# Patient Record
Sex: Female | Born: 1965 | Race: White | Hispanic: No | Marital: Married | Smoking: Light tobacco smoker
Health system: Southern US, Community
[De-identification: ages and names within clinical notes are randomized; demographics above are authoritative.]

## PROBLEM LIST (undated history)

## (undated) DIAGNOSIS — IMO0002 Reserved for concepts with insufficient information to code with codable children: Secondary | ICD-10-CM

## (undated) DIAGNOSIS — Z79890 Hormone replacement therapy (postmenopausal): Secondary | ICD-10-CM

## (undated) DIAGNOSIS — R87629 Unspecified abnormal cytological findings in specimens from vagina: Secondary | ICD-10-CM

## (undated) DIAGNOSIS — M5412 Radiculopathy, cervical region: Secondary | ICD-10-CM

## (undated) DIAGNOSIS — R87619 Unspecified abnormal cytological findings in specimens from cervix uteri: Secondary | ICD-10-CM

## (undated) DIAGNOSIS — M47812 Spondylosis without myelopathy or radiculopathy, cervical region: Secondary | ICD-10-CM

## (undated) DIAGNOSIS — G500 Trigeminal neuralgia: Secondary | ICD-10-CM

## (undated) DIAGNOSIS — N951 Menopausal and female climacteric states: Secondary | ICD-10-CM

## (undated) HISTORY — DX: Trigeminal neuralgia: G50.0

## (undated) HISTORY — DX: Hormone replacement therapy: Z79.890

## (undated) HISTORY — DX: Unspecified abnormal cytological findings in specimens from vagina: R87.629

## (undated) HISTORY — DX: Radiculopathy, cervical region: M54.12

## (undated) HISTORY — DX: Reserved for concepts with insufficient information to code with codable children: IMO0002

## (undated) HISTORY — DX: Unspecified abnormal cytological findings in specimens from cervix uteri: R87.619

## (undated) HISTORY — DX: Spondylosis without myelopathy or radiculopathy, cervical region: M47.812

## (undated) HISTORY — DX: Menopausal and female climacteric states: N95.1

## (undated) HISTORY — PX: TONSILLECTOMY AND ADENOIDECTOMY: SHX28

---

## 2001-10-22 ENCOUNTER — Other Ambulatory Visit: Admission: RE | Admit: 2001-10-22 | Discharge: 2001-10-22 | Payer: Self-pay | Admitting: Obstetrics and Gynecology

## 2004-02-18 ENCOUNTER — Other Ambulatory Visit: Admission: RE | Admit: 2004-02-18 | Discharge: 2004-02-18 | Payer: Self-pay | Admitting: Obstetrics & Gynecology

## 2005-06-05 ENCOUNTER — Emergency Department (HOSPITAL_COMMUNITY): Admission: EM | Admit: 2005-06-05 | Discharge: 2005-06-05 | Payer: Self-pay | Admitting: Emergency Medicine

## 2006-11-27 ENCOUNTER — Ambulatory Visit (HOSPITAL_COMMUNITY): Admission: RE | Admit: 2006-11-27 | Discharge: 2006-11-27 | Payer: Self-pay | Admitting: Family Medicine

## 2008-01-01 ENCOUNTER — Other Ambulatory Visit: Admission: RE | Admit: 2008-01-01 | Discharge: 2008-01-01 | Payer: Self-pay | Admitting: Obstetrics and Gynecology

## 2008-11-21 ENCOUNTER — Ambulatory Visit (HOSPITAL_COMMUNITY): Admission: RE | Admit: 2008-11-21 | Discharge: 2008-11-21 | Payer: Self-pay | Admitting: Family Medicine

## 2009-01-02 ENCOUNTER — Ambulatory Visit (HOSPITAL_COMMUNITY): Admission: RE | Admit: 2009-01-02 | Discharge: 2009-01-02 | Payer: Self-pay | Admitting: Obstetrics & Gynecology

## 2009-04-23 ENCOUNTER — Other Ambulatory Visit: Admission: RE | Admit: 2009-04-23 | Discharge: 2009-04-23 | Payer: Self-pay | Admitting: Obstetrics & Gynecology

## 2010-01-04 ENCOUNTER — Ambulatory Visit (HOSPITAL_COMMUNITY): Admission: RE | Admit: 2010-01-04 | Discharge: 2010-01-04 | Payer: Self-pay | Admitting: Obstetrics & Gynecology

## 2010-09-05 ENCOUNTER — Encounter: Payer: Self-pay | Admitting: Obstetrics & Gynecology

## 2010-11-01 ENCOUNTER — Other Ambulatory Visit (HOSPITAL_COMMUNITY)
Admission: RE | Admit: 2010-11-01 | Discharge: 2010-11-01 | Disposition: A | Discharge status: Home / Self Care | Payer: BC Managed Care – PPO | Source: Ambulatory Visit | Attending: Obstetrics and Gynecology | Admitting: Obstetrics and Gynecology

## 2010-11-01 ENCOUNTER — Other Ambulatory Visit: Payer: Self-pay | Admitting: Adult Health

## 2010-11-01 DIAGNOSIS — Z01419 Encounter for gynecological examination (general) (routine) without abnormal findings: Secondary | ICD-10-CM | POA: Insufficient documentation

## 2010-12-20 ENCOUNTER — Other Ambulatory Visit: Payer: Self-pay | Admitting: Obstetrics & Gynecology

## 2010-12-20 DIAGNOSIS — Z139 Encounter for screening, unspecified: Secondary | ICD-10-CM

## 2011-01-07 ENCOUNTER — Ambulatory Visit (HOSPITAL_COMMUNITY): Payer: BC Managed Care – PPO

## 2011-02-18 ENCOUNTER — Ambulatory Visit (HOSPITAL_COMMUNITY): Payer: BC Managed Care – PPO

## 2011-03-17 ENCOUNTER — Ambulatory Visit (HOSPITAL_COMMUNITY)
Admission: RE | Admit: 2011-03-17 | Discharge: 2011-03-17 | Disposition: A | Discharge status: Home / Self Care | Payer: BC Managed Care – PPO | Source: Ambulatory Visit | Attending: Obstetrics & Gynecology | Admitting: Obstetrics & Gynecology

## 2011-03-17 DIAGNOSIS — Z139 Encounter for screening, unspecified: Secondary | ICD-10-CM

## 2011-03-17 DIAGNOSIS — Z1231 Encounter for screening mammogram for malignant neoplasm of breast: Secondary | ICD-10-CM | POA: Insufficient documentation

## 2011-04-16 ENCOUNTER — Emergency Department (HOSPITAL_COMMUNITY): Admission: EM | Admit: 2011-04-16 | Payer: BC Managed Care – PPO | Source: Home / Self Care

## 2012-03-27 ENCOUNTER — Other Ambulatory Visit: Payer: Self-pay | Admitting: Obstetrics & Gynecology

## 2012-03-27 DIAGNOSIS — Z139 Encounter for screening, unspecified: Secondary | ICD-10-CM

## 2012-04-05 ENCOUNTER — Ambulatory Visit (HOSPITAL_COMMUNITY): Payer: BC Managed Care – PPO

## 2012-04-05 ENCOUNTER — Ambulatory Visit (HOSPITAL_COMMUNITY)
Admission: RE | Admit: 2012-04-05 | Discharge: 2012-04-05 | Disposition: A | Discharge status: Home / Self Care | Payer: BC Managed Care – PPO | Source: Ambulatory Visit | Attending: Obstetrics & Gynecology | Admitting: Obstetrics & Gynecology

## 2012-04-05 DIAGNOSIS — Z139 Encounter for screening, unspecified: Secondary | ICD-10-CM

## 2012-04-05 DIAGNOSIS — R922 Inconclusive mammogram: Secondary | ICD-10-CM | POA: Insufficient documentation

## 2012-04-05 DIAGNOSIS — Z1231 Encounter for screening mammogram for malignant neoplasm of breast: Secondary | ICD-10-CM | POA: Insufficient documentation

## 2012-04-09 ENCOUNTER — Other Ambulatory Visit: Payer: Self-pay | Admitting: Obstetrics & Gynecology

## 2012-04-09 DIAGNOSIS — R928 Other abnormal and inconclusive findings on diagnostic imaging of breast: Secondary | ICD-10-CM

## 2012-04-18 ENCOUNTER — Ambulatory Visit (HOSPITAL_COMMUNITY)
Admission: RE | Admit: 2012-04-18 | Discharge: 2012-04-18 | Disposition: A | Discharge status: Home / Self Care | Payer: BC Managed Care – PPO | Source: Ambulatory Visit | Attending: Obstetrics & Gynecology | Admitting: Obstetrics & Gynecology

## 2012-04-18 ENCOUNTER — Other Ambulatory Visit: Payer: Self-pay | Admitting: Obstetrics & Gynecology

## 2012-04-18 DIAGNOSIS — R928 Other abnormal and inconclusive findings on diagnostic imaging of breast: Secondary | ICD-10-CM

## 2012-04-18 DIAGNOSIS — Z853 Personal history of malignant neoplasm of breast: Secondary | ICD-10-CM | POA: Insufficient documentation

## 2012-04-18 DIAGNOSIS — N6009 Solitary cyst of unspecified breast: Secondary | ICD-10-CM | POA: Insufficient documentation

## 2012-04-19 ENCOUNTER — Telehealth: Payer: Self-pay | Admitting: *Deleted

## 2012-04-19 NOTE — Telephone Encounter (Signed)
Patient returned my call and I confirmed 06/04/12 genetic appt w/ her.

## 2012-04-19 NOTE — Telephone Encounter (Signed)
Left message for pt to return my call so I can schedule a genetic appt.  

## 2012-05-11 ENCOUNTER — Telehealth: Payer: Self-pay | Admitting: *Deleted

## 2012-05-11 NOTE — Telephone Encounter (Signed)
Left message for pt again about rescheduling her genetic appt.

## 2012-05-31 ENCOUNTER — Telehealth: Payer: Self-pay | Admitting: *Deleted

## 2012-05-31 NOTE — Telephone Encounter (Signed)
Patient called stating something came up and she cannot make her appt  06/04/12.  She requested that I cancel it and she will call back at a later date to reschedule.

## 2012-06-04 ENCOUNTER — Other Ambulatory Visit: Payer: BC Managed Care – PPO | Admitting: Lab

## 2012-06-04 ENCOUNTER — Encounter: Payer: BC Managed Care – PPO | Admitting: Genetic Counselor

## 2012-07-13 ENCOUNTER — Emergency Department (HOSPITAL_COMMUNITY)
Admission: EM | Admit: 2012-07-13 | Discharge: 2012-07-13 | Disposition: A | Discharge status: Home / Self Care | Payer: BC Managed Care – PPO | Attending: Emergency Medicine | Admitting: Emergency Medicine

## 2012-07-13 ENCOUNTER — Encounter (HOSPITAL_COMMUNITY): Payer: Self-pay | Admitting: *Deleted

## 2012-07-13 DIAGNOSIS — F17200 Nicotine dependence, unspecified, uncomplicated: Secondary | ICD-10-CM | POA: Insufficient documentation

## 2012-07-13 DIAGNOSIS — M5412 Radiculopathy, cervical region: Secondary | ICD-10-CM | POA: Insufficient documentation

## 2012-07-13 MED ORDER — OXYCODONE-ACETAMINOPHEN 5-325 MG PO TABS
2.0000 | ORAL_TABLET | Freq: Once | ORAL | Status: AC
  Administered 2012-07-13: 2 via ORAL
  Filled 2012-07-13: qty 2

## 2012-07-13 MED ORDER — IBUPROFEN 400 MG PO TABS
400.0000 mg | ORAL_TABLET | Freq: Once | ORAL | Status: AC
  Administered 2012-07-13: 400 mg via ORAL
  Filled 2012-07-13: qty 1

## 2012-07-13 MED ORDER — NAPROXEN 500 MG PO TABS: 500.0000 mg | ORAL_TABLET | Freq: Two times a day (BID) | ORAL | Status: DC | PRN

## 2012-07-13 NOTE — ED Notes (Signed)
Pt c/o neck pain and tingling to left arm and shoulder. Pt states sx began Thursday am and has been on and off all day. Pt states it has gotten worse in the last hour.

## 2012-07-17 NOTE — ED Provider Notes (Signed)
History    46 year old female left neck pain. Denies trauma. Symptoms began Thursday and have been intermittent. Pain radiates into her left shoulder down her left arm. Feels like pinpricks and tingling. No appreciable exacerbating / relieving factors. No weakness. Patient with no other complaints.  CSN: [id number]  Arrival date & time 07/13/12  0136   First MD Initiated Contact with Patient 07/13/12 0151      Chief Complaint  Patient presents with  . Neck Pain    (Consider location/radiation/quality/duration/timing/severity/associated sxs/prior treatment) HPI  History reviewed. No pertinent past medical history.  History reviewed. No pertinent past surgical history.  Family History  Problem Relation Age of Onset  . Cancer Mother     History  Substance Use Topics  . Smoking status: Current Some Day Smoker    Types: Cigarettes  . Smokeless tobacco: Not on file  . Alcohol Use: Yes     Comment: occasional    OB History    Grav Para Term Preterm Abortions TAB SAB Ect Mult Living                  Review of Systems   Review of symptoms negative unless otherwise noted in HPI.   Allergies  Review of patient's allergies indicates no known allergies.  Home Medications   Current Outpatient Rx  Name  Route  Sig  Dispense  Refill  . NAPROXEN 500 MG PO TABS   Oral   Take 1 tablet (500 mg total) by mouth 2 (two) times daily as needed.   20 tablet   0     BP 117/69  Pulse 68  Temp 97.9 F (36.6 C) (Oral)  Resp 20  Ht 5\' 11"  (1.803 m)  Wt 140 lb (63.504 kg)  BMI 19.53 kg/m2  SpO2 99%  LMP 07/13/2012  Physical Exam  Nursing note and vitals reviewed. Constitutional: She appears well-developed and well-nourished. No distress.  HENT:  Head: Normocephalic and atraumatic.  Eyes: Conjunctivae normal are normal. Right eye exhibits no discharge. Left eye exhibits no discharge.  Neck: Neck supple.  Cardiovascular: Normal rate, regular rhythm and normal heart  sounds.  Exam reveals no gallop and no friction rub.   No murmur heard. Pulmonary/Chest: Effort normal and breath sounds normal. No respiratory distress.  Abdominal: Soft. She exhibits no distension. There is no tenderness.  Musculoskeletal: She exhibits no edema and no tenderness.       No midline spinal tenderness.  Neurological: She is alert. She exhibits normal muscle tone. Coordination normal.       Strength 5/5 b/l upper extremities. Median/ulnar and radial function intact. Good radial pulses b/l.  Skin: Skin is warm and dry.  Psychiatric: She has a normal mood and affect. Her behavior is normal. Thought content normal.    ED Course  Procedures (including critical care time)  Labs Reviewed - No data to display No results found.   1. Cervical radiculopathy       MDM  46 year old female with atraumatic left neck and left upper extremity pain. Symptoms consistent with a cervical radiculopathy. No indication for emergent imaging. Neuro exam is nonfocal. Plan symptomatic treatment.         Raeford Razor, MD 07/17/12 0005

## 2012-08-16 ENCOUNTER — Other Ambulatory Visit (HOSPITAL_COMMUNITY): Payer: Self-pay | Admitting: Neurosurgery

## 2012-08-16 DIAGNOSIS — M4802 Spinal stenosis, cervical region: Secondary | ICD-10-CM

## 2012-10-18 ENCOUNTER — Telehealth: Payer: Self-pay | Admitting: *Deleted

## 2012-10-18 NOTE — Telephone Encounter (Signed)
Pt called stating that her mom just tested positive and wants to come and talk with Clydie Braun.  Confirmed 12/24/12 appt w pt.

## 2012-11-09 ENCOUNTER — Telehealth: Payer: Self-pay | Admitting: *Deleted

## 2012-11-09 NOTE — Telephone Encounter (Signed)
Pt called requesting for me to cancel her genetic appt on 5/12 due to having the testing done in [facility] where her mom had it.  Cancelled appt as pt requested.

## 2012-12-24 ENCOUNTER — Encounter: Payer: BC Managed Care – PPO | Admitting: Genetic Counselor

## 2012-12-24 ENCOUNTER — Other Ambulatory Visit: Payer: BC Managed Care – PPO | Admitting: Lab

## 2013-02-11 ENCOUNTER — Telehealth: Payer: Self-pay | Admitting: Family Medicine

## 2013-02-11 ENCOUNTER — Other Ambulatory Visit: Payer: Self-pay | Admitting: Nurse Practitioner

## 2013-02-11 MED ORDER — NAPROXEN 500 MG PO TABS: 500.0000 mg | ORAL_TABLET | Freq: Two times a day (BID) | ORAL | Status: DC

## 2013-02-11 NOTE — Telephone Encounter (Signed)
Pt had 4 ruptured discs 3 or 4 years ago and was referred to Dr. Channing Mutters. He is currently out of town and she was instructed to reach out to her family doctor to get a prescription for an anti-inflammatory because she is having an irritation in her neck area. She uses [facility].

## 2013-02-11 NOTE — Telephone Encounter (Signed)
naproxen

## 2013-04-09 ENCOUNTER — Other Ambulatory Visit: Payer: Self-pay | Admitting: Obstetrics & Gynecology

## 2013-04-09 DIAGNOSIS — Z139 Encounter for screening, unspecified: Secondary | ICD-10-CM

## 2013-04-22 ENCOUNTER — Ambulatory Visit (HOSPITAL_COMMUNITY)
Admission: RE | Admit: 2013-04-22 | Discharge: 2013-04-22 | Disposition: A | Discharge status: Home / Self Care | Payer: BC Managed Care – PPO | Source: Ambulatory Visit | Attending: Obstetrics & Gynecology | Admitting: Obstetrics & Gynecology

## 2013-04-22 DIAGNOSIS — Z139 Encounter for screening, unspecified: Secondary | ICD-10-CM

## 2013-04-22 DIAGNOSIS — Z1231 Encounter for screening mammogram for malignant neoplasm of breast: Secondary | ICD-10-CM | POA: Insufficient documentation

## 2013-04-23 ENCOUNTER — Other Ambulatory Visit: Payer: Self-pay | Admitting: Obstetrics & Gynecology

## 2013-04-23 DIAGNOSIS — R928 Other abnormal and inconclusive findings on diagnostic imaging of breast: Secondary | ICD-10-CM

## 2013-05-01 ENCOUNTER — Other Ambulatory Visit (HOSPITAL_COMMUNITY): Payer: Self-pay | Admitting: Obstetrics & Gynecology

## 2013-05-01 ENCOUNTER — Ambulatory Visit (HOSPITAL_COMMUNITY)
Admission: RE | Admit: 2013-05-01 | Discharge: 2013-05-01 | Disposition: A | Discharge status: Home / Self Care | Payer: BC Managed Care – PPO | Source: Ambulatory Visit | Attending: Obstetrics & Gynecology | Admitting: Obstetrics & Gynecology

## 2013-05-01 DIAGNOSIS — R928 Other abnormal and inconclusive findings on diagnostic imaging of breast: Secondary | ICD-10-CM

## 2013-06-04 ENCOUNTER — Ambulatory Visit (INDEPENDENT_AMBULATORY_CARE_PROVIDER_SITE_OTHER): Payer: BC Managed Care – PPO | Admitting: Adult Health

## 2013-06-04 ENCOUNTER — Other Ambulatory Visit (HOSPITAL_COMMUNITY)
Admission: RE | Admit: 2013-06-04 | Discharge: 2013-06-04 | Disposition: A | Discharge status: Home / Self Care | Payer: BC Managed Care – PPO | Source: Ambulatory Visit | Attending: Adult Health | Admitting: Adult Health

## 2013-06-04 ENCOUNTER — Encounter: Payer: Self-pay | Admitting: Adult Health

## 2013-06-04 ENCOUNTER — Encounter (INDEPENDENT_AMBULATORY_CARE_PROVIDER_SITE_OTHER): Payer: Self-pay

## 2013-06-04 VITALS — BP 110/60 | HR 74 | Ht 71.0 in | Wt 152.0 lb

## 2013-06-04 DIAGNOSIS — Z01419 Encounter for gynecological examination (general) (routine) without abnormal findings: Secondary | ICD-10-CM

## 2013-06-04 DIAGNOSIS — N951 Menopausal and female climacteric states: Secondary | ICD-10-CM

## 2013-06-04 DIAGNOSIS — Z1151 Encounter for screening for human papillomavirus (HPV): Secondary | ICD-10-CM | POA: Insufficient documentation

## 2013-06-04 DIAGNOSIS — Z1212 Encounter for screening for malignant neoplasm of rectum: Secondary | ICD-10-CM

## 2013-06-04 HISTORY — DX: Menopausal and female climacteric states: N95.1

## 2013-06-04 LAB — HEMOCCULT GUIAC POC 1CARD (OFFICE)

## 2013-06-04 LAB — CBC
MCHC: 34.1 g/dL (ref 30.0–36.0)
MCV: 92.5 fL (ref 78.0–100.0)
RBC: 3.99 MIL/uL (ref 3.87–5.11)

## 2013-06-04 NOTE — Patient Instructions (Signed)

## 2013-06-04 NOTE — Progress Notes (Signed)
Patient ID: Tina Galloway, female   DOB: [birth year], 47 y.o.   MRN: [id number] History of Present Illness: Tina Galloway is a 47 year old white female recently remarried in for pap and physical.She is complaining of hot flashes, weight fluctuations of about 10 lbs,not sleeping well, some headaches but grinds teeth and got braces,has been moody and has been having irregular periods.Smokes occasionally. MOM had breast cancer and she had testing and was BRCA negative.  Current Medications, Allergies, Past Medical History, Past Surgical History, Family History and Social History were reviewed in Tina Galloway record.     Review of Systems: Patient denies any  blurred vision, shortness of breath, chest pain, abdominal pain, problems with bowel movements, urination, or intercourse. Positives in HPI.    Physical Exam:BP 110/60  Pulse 74  Ht 5\' 11"  (1.803 m)  Wt 152 lb (68.947 kg)  BMI 21.21 kg/m2  LMP 05/02/2013 General:  Well developed, well nourished, no acute distress Skin:  Warm and dry Neck:  Midline trachea, normal thyroid Lungs; Clear to auscultation bilaterally Breast:  No dominant palpable mass, retraction, or nipple discharge Cardiovascular: Regular rate and rhythm Abdomen:  Soft, non tender, no hepatosplenomegaly Pelvic:  External genitalia is normal in appearance.  The vagina is normal in appearance. The cervix is bulbous. Pap with HPV. Uterus is felt to be normal size, shape, and contour.  No  adnexal masses or tenderness noted. Rectal: Good sphincter tone, no polyps, or hemorrhoids felt.  Hemoccult negative. Extremities:  No swelling or varicosities noted Psych:  Alert and cooperative   Impression: Yearly gyn exam Menopausal symptoms    Plan: Check CBC,CMP,TSH and FSH Physical in 1 year Mammogram yearly Discussed HRT and will give handout to review Follow up for lab results

## 2013-06-05 ENCOUNTER — Telehealth: Payer: Self-pay | Admitting: Adult Health

## 2013-06-05 LAB — FOLLICLE STIMULATING HORMONE: FSH: 3.8 m[IU]/mL

## 2013-06-05 LAB — COMPREHENSIVE METABOLIC PANEL
ALT: 11 U/L (ref 0–35)
AST: 17 U/L (ref 0–37)
Albumin: 4.3 g/dL (ref 3.5–5.2)
BUN: 9 mg/dL (ref 6–23)
Chloride: 109 mEq/L (ref 96–112)
Potassium: 3.7 mEq/L (ref 3.5–5.3)
Total Bilirubin: 0.5 mg/dL (ref 0.3–1.2)
Total Protein: 6.6 g/dL (ref 6.0–8.3)

## 2013-06-05 NOTE — Telephone Encounter (Signed)
Left message that labs and pap were normal, to call me back

## 2013-06-06 ENCOUNTER — Telehealth: Payer: Self-pay

## 2013-06-06 MED ORDER — ESTRADIOL-NORETHINDRONE ACET 0.05-0.14 MG/DAY TD PTTW: 1.0000 | MEDICATED_PATCH | TRANSDERMAL | Status: DC

## 2013-06-06 NOTE — Telephone Encounter (Signed)
Pt aware of labs but would like to try hormone patch to see if she feels better

## 2013-06-17 ENCOUNTER — Encounter: Payer: Self-pay | Admitting: Adult Health

## 2013-06-19 ENCOUNTER — Other Ambulatory Visit: Payer: Self-pay | Admitting: Adult Health

## 2013-06-19 MED ORDER — ESTRADIOL-NORETHINDRONE ACET 1-0.5 MG PO TABS: 1.0000 | ORAL_TABLET | Freq: Every day | ORAL | Status: DC

## 2013-06-20 ENCOUNTER — Other Ambulatory Visit: Payer: Self-pay

## 2013-08-02 ENCOUNTER — Other Ambulatory Visit: Payer: Self-pay | Admitting: *Deleted

## 2013-08-02 MED ORDER — ESTRADIOL-NORETHINDRONE ACET 1-0.5 MG PO TABS: 1.0000 | ORAL_TABLET | Freq: Every day | ORAL | Status: DC

## 2013-08-12 ENCOUNTER — Telehealth: Payer: Self-pay | Admitting: *Deleted

## 2013-08-12 NOTE — Telephone Encounter (Signed)
Left message letting pt know Mimvey had been refilled on 08/02/13, 90 day supply [facility] [place]. JSY

## 2014-01-21 ENCOUNTER — Other Ambulatory Visit: Payer: Self-pay | Admitting: Adult Health

## 2014-04-16 ENCOUNTER — Other Ambulatory Visit: Payer: Self-pay | Admitting: Obstetrics and Gynecology

## 2014-04-16 DIAGNOSIS — Z1231 Encounter for screening mammogram for malignant neoplasm of breast: Secondary | ICD-10-CM

## 2014-04-17 ENCOUNTER — Encounter: Payer: Self-pay | Admitting: Nurse Practitioner

## 2014-04-17 ENCOUNTER — Ambulatory Visit (INDEPENDENT_AMBULATORY_CARE_PROVIDER_SITE_OTHER): Payer: BC Managed Care – PPO | Admitting: Nurse Practitioner

## 2014-04-17 VITALS — BP 110/74 | Ht 71.0 in | Wt 152.0 lb

## 2014-04-17 DIAGNOSIS — J3089 Other allergic rhinitis: Secondary | ICD-10-CM

## 2014-04-17 DIAGNOSIS — J0100 Acute maxillary sinusitis, unspecified: Secondary | ICD-10-CM

## 2014-04-17 DIAGNOSIS — J302 Other seasonal allergic rhinitis: Secondary | ICD-10-CM

## 2014-04-17 MED ORDER — AZITHROMYCIN 250 MG PO TABS
ORAL_TABLET | ORAL | Status: DC
Start: 2014-04-17 — End: 2014-06-06

## 2014-04-17 MED ORDER — METHYLPREDNISOLONE ACETATE 40 MG/ML IJ SUSP
40.0000 mg | Freq: Once | INTRAMUSCULAR | Status: AC
Start: 2014-04-17 — End: 2014-04-17
  Administered 2014-04-17: 40 mg via INTRAMUSCULAR

## 2014-04-17 NOTE — Patient Instructions (Signed)
OTC antihistamine Nasacort AQ as directed Zaditor eye drops 

## 2014-04-20 ENCOUNTER — Encounter: Payer: Self-pay | Admitting: Nurse Practitioner

## 2014-04-20 NOTE — Progress Notes (Signed)
Subjective:  Presents with complaints of sinus symptoms over the past week. Initially made an appointment for her left neck and shoulder pain which flared up after running a 5K. This is a chronic problem that is being followed by neurosurgery. It was worse for 7-10 days, much improved today. Having maxillary area sinus pressure. Worse with bending. It radiates into the teeth. No fever. Slight nonproductive cough in the mornings. Watery eyes. Runny nose. Sneezing. Slight wheezing for the past 2 days. Nonsmoker.  Objective:   BP 110/74  Ht  (1.803 m)  Wt 152 lb (68.947 kg)  BMI 21.21 kg/m2  LMP 04/11/2014 NAD. Alert, oriented. TMs significant effusion, no erythema. Pharynx injected with PND noted. Neck supple with mild soft anterior adenopathy. Lungs clear. Heart regular rate rhythm.  Assessment: Acute maxillary sinusitis, recurrence not specified - Plan: methylPREDNISolone acetate (DEPO-MEDROL) injection 40 mg  Other seasonal allergic rhinitis - Plan: methylPREDNISolone acetate (DEPO-MEDROL) injection 40 mg  Plan:  Meds ordered this encounter  Medications  . azithromycin (ZITHROMAX Z-PAK) 250 MG tablet    Sig: Take 2 tablets (500 mg) on  Day 1,  followed by 1 tablet (250 mg) once daily on Days 2 through 5.    Dispense:  6 each    Refill:  0    Order Specific Question:  Supervising Provider    Answer:  Merlyn Albert [[id number]]  . methylPREDNISolone acetate (DEPO-MEDROL) injection 40 mg    Sig:    OTC antihistamine Nasacort AQ as directed Zaditor eye drops  Call back if symptoms worsen or persist.

## 2014-05-05 ENCOUNTER — Ambulatory Visit (HOSPITAL_COMMUNITY)
Admission: RE | Admit: 2014-05-05 | Discharge: 2014-05-05 | Disposition: A | Discharge status: Home / Self Care | Payer: BC Managed Care – PPO | Source: Ambulatory Visit | Attending: Obstetrics and Gynecology | Admitting: Obstetrics and Gynecology

## 2014-05-05 DIAGNOSIS — Z1231 Encounter for screening mammogram for malignant neoplasm of breast: Secondary | ICD-10-CM | POA: Diagnosis present

## 2014-05-30 ENCOUNTER — Other Ambulatory Visit: Payer: Self-pay

## 2014-06-06 ENCOUNTER — Encounter: Payer: Self-pay | Admitting: Adult Health

## 2014-06-06 ENCOUNTER — Ambulatory Visit (INDEPENDENT_AMBULATORY_CARE_PROVIDER_SITE_OTHER): Payer: BC Managed Care – PPO | Admitting: Adult Health

## 2014-06-06 VITALS — BP 110/72 | HR 72 | Ht 68.0 in | Wt 148.0 lb

## 2014-06-06 DIAGNOSIS — Z1212 Encounter for screening for malignant neoplasm of rectum: Secondary | ICD-10-CM

## 2014-06-06 DIAGNOSIS — Z01419 Encounter for gynecological examination (general) (routine) without abnormal findings: Secondary | ICD-10-CM

## 2014-06-06 LAB — HEMOCCULT GUIAC POC 1CARD (OFFICE): Fecal Occult Blood, POC: NEGATIVE

## 2014-06-06 NOTE — Progress Notes (Signed)
Patient ID: Tina Galloway, female   DOB: 03-01-1966, 48 y.o.   MRN: [id number] History of Present Illness: Tina Galloway is a 48 year old white female, widowed, in for a physical.She had a normal pap with negative HPV 06/04/13.No complaints.   Current Medications, Allergies, Past Medical History, Past Surgical History, Family History and Social History were reviewed in [facility] electronic medical record.     Review of Systems: Patient denies any headaches, blurred vision, shortness of breath, chest pain, abdominal pain, problems with bowel movements, urination, or intercourse. No joint pain or mood swings, has stopped HRT doing good at present.    Physical Exam:BP 110/72  Pulse 72  Ht 5\' 8"  (1.727 m)  Wt 148 lb (67.132 kg)  BMI 22.51 kg/m2  LMP 05/23/2014 General:  Well developed, well nourished, no acute distress Skin:  Warm and dry Neck:  Midline trachea, normal thyroid Lungs; Clear to auscultation bilaterally Breast:  No dominant palpable mass, retraction, or nipple discharge Cardiovascular: Regular rate and rhythm Abdomen:  Soft, non tender, no hepatosplenomegaly Pelvic:  External genitalia is normal in appearance.  The vagina is normal in appearance.  The cervix is bulbous.  Uterus is felt to be normal size, shape, and contour.  No  adnexal masses or tenderness noted. Rectal: Good sphincter tone, no polyps, or hemorrhoids felt.  Hemoccult negative. Extremities:  No swelling or varicosities noted Psych:  No mood changes,alert and cooperative,seems happy   Impression: Well woman gyn exam no pap    Plan: Physical in 1 year Mammogram yearly  Colonoscopy at 52  Fasting labs next year Get flu shot

## 2014-06-06 NOTE — Patient Instructions (Signed)
Physical in 1 year mammogram yearly colonoscopy at 52 Fasting labs next year

## 2014-06-16 ENCOUNTER — Encounter: Payer: Self-pay | Admitting: Adult Health

## 2014-07-19 ENCOUNTER — Emergency Department (HOSPITAL_COMMUNITY)
Admission: EM | Admit: 2014-07-19 | Discharge: 2014-07-19 | Disposition: A | Discharge status: Home / Self Care | Payer: BC Managed Care – PPO | Attending: Emergency Medicine | Admitting: Emergency Medicine

## 2014-07-19 ENCOUNTER — Encounter (HOSPITAL_COMMUNITY): Payer: Self-pay

## 2014-07-19 DIAGNOSIS — L0201 Cutaneous abscess of face: Secondary | ICD-10-CM | POA: Diagnosis not present

## 2014-07-19 DIAGNOSIS — Z792 Long term (current) use of antibiotics: Secondary | ICD-10-CM | POA: Diagnosis not present

## 2014-07-19 DIAGNOSIS — Z791 Long term (current) use of non-steroidal anti-inflammatories (NSAID): Secondary | ICD-10-CM | POA: Insufficient documentation

## 2014-07-19 DIAGNOSIS — Z720 Tobacco use: Secondary | ICD-10-CM | POA: Diagnosis not present

## 2014-07-19 MED ORDER — ONDANSETRON HCL 4 MG PO TABS
ORAL_TABLET | ORAL | Status: AC
  Filled 2014-07-19: qty 1

## 2014-07-19 MED ORDER — HYDROCODONE-ACETAMINOPHEN 5-325 MG PO TABS: 1.0000 | ORAL_TABLET | ORAL | Status: DC | PRN

## 2014-07-19 MED ORDER — DOXYCYCLINE HYCLATE 100 MG PO TABS
ORAL_TABLET | ORAL | Status: DC
Start: 2014-07-19 — End: 2014-07-19
  Filled 2014-07-19: qty 1

## 2014-07-19 MED ORDER — IBUPROFEN 600 MG PO TABS: 600.0000 mg | ORAL_TABLET | Freq: Four times a day (QID) | ORAL | Status: DC

## 2014-07-19 MED ORDER — DOXYCYCLINE HYCLATE 100 MG PO CAPS: 100.0000 mg | ORAL_CAPSULE | Freq: Two times a day (BID) | ORAL | Status: DC

## 2014-07-19 MED ORDER — DOXYCYCLINE HYCLATE 100 MG PO TABS
100.0000 mg | ORAL_TABLET | Freq: Once | ORAL | Status: AC
  Administered 2014-07-19: 100 mg via ORAL

## 2014-07-19 MED ORDER — KETOROLAC TROMETHAMINE 10 MG PO TABS
ORAL_TABLET | ORAL | Status: AC
  Filled 2014-07-19: qty 1

## 2014-07-19 MED ORDER — ONDANSETRON HCL 4 MG PO TABS
4.0000 mg | ORAL_TABLET | Freq: Once | ORAL | Status: AC
  Administered 2014-07-19: 4 mg via ORAL

## 2014-07-19 MED ORDER — KETOROLAC TROMETHAMINE 10 MG PO TABS
10.0000 mg | ORAL_TABLET | Freq: Once | ORAL | Status: AC
  Administered 2014-07-19: 10 mg via ORAL

## 2014-07-19 NOTE — Discharge Instructions (Signed)
Please apply warm compresses 3-4 times daily to the left face. Please use doxycycline and ibuprofen daily. Use Norco for pain if needed. Norco may cause drowsiness, please use with caution. Please see Dr. Lovell SheehanJenkins, or return to the emergency department if the abscess Abscess An abscess (boil or furuncle) is an infected area on or under the skin. This area is filled with yellowish-white fluid (pus) and other material (debris). HOME CARE   Only take medicines as told by your doctor.  If you were given antibiotic medicine, take it as directed. Finish the medicine even if you start to feel better.  If gauze is used, follow your doctor's directions for changing the gauze.  To avoid spreading the infection:  Keep your abscess covered with a bandage.  Wash your hands well.  Do not share personal care items, towels, or whirlpools with others.  Avoid skin contact with others.  Keep your skin and clothes clean around the abscess.  Keep all doctor visits as told. GET HELP RIGHT AWAY IF:   You have more pain, puffiness (swelling), or redness in the wound site.  You have more fluid or blood coming from the wound site.  You have muscle aches, chills, or you feel sick.  You have a fever. MAKE SURE YOU:   Understand these instructions.  Will watch your condition.  Will get help right away if you are not doing well or get worse. Document Released: 01/18/2008 Document Revised: 01/31/2012 Document Reviewed: 10/14/2011 [facility] Patient Information 2015 [employer] [place]. This information is not intended to replace advice given to you by your health care provider. Make sure you discuss any questions you have with your health care provider.  is getting worse.

## 2014-07-19 NOTE — ED Notes (Signed)
Complain pain and swelling to an abscess to right side of face

## 2014-07-19 NOTE — ED Provider Notes (Signed)
CSN: [id number]     Arrival date & time 07/19/14  [id number] History   First MD Initiated Contact with Patient 07/19/14 [phone]     Chief Complaint  Patient presents with  . Abscess     (Consider location/radiation/quality/duration/timing/severity/associated sxs/prior Treatment) Patient is a 48 y.o. female presenting with abscess. The history is provided by the patient.  Abscess Location:  Face Facial abscess location:  Face Abscess quality: painful and redness   Abscess quality: not draining, no itching and not weeping   Red streaking: no   Duration:  3 days Progression:  Worsening Pain details:    Quality:  Sharp   Severity:  Moderate   Timing:  Intermittent   Progression:  Worsening Chronicity:  New Context: not diabetes and not immunosuppression   Relieved by:  Nothing Worsened by:  Warm compresses Ineffective treatments:  Draining/squeezing, warm compresses and topical antibiotics Associated symptoms: no fever, no nausea and no vomiting   Risk factors: no family hx of MRSA and no hx of MRSA     Past Medical History  Diagnosis Date  . Abnormal Pap smear   . Menopausal symptoms 06/04/2013  . Vaginal Pap smear, abnormal    Past Surgical History  Procedure Laterality Date  . Tonsillectomy and adenoidectomy     Family History  Problem Relation Age of Onset  . Cancer Mother 5661    breast    History  Substance Use Topics  . Smoking status: Light Tobacco Smoker    Types: Cigarettes  . Smokeless tobacco: Never Used     Comment: occ. smoker  . Alcohol Use: Yes     Comment: occasional   OB History    Gravida Para Term Preterm AB TAB SAB Ectopic Multiple Living   2 2        2      Review of Systems  Constitutional: Negative for fever.  Gastrointestinal: Negative for nausea and vomiting.      Allergies  Review of patient's allergies indicates no known allergies.  Home Medications   Prior to Admission medications   Medication Sig Start Date End Date Taking?  Authorizing Provider  doxycycline (VIBRAMYCIN) 100 MG capsule Take 1 capsule (100 mg total) by mouth 2 (two) times daily. 07/19/14   Kathie DikeHobson M Daveion Robar, PA-C  HYDROcodone-acetaminophen (NORCO/VICODIN) 5-325 MG per tablet Take 1 tablet by mouth every 4 (four) hours as needed. 07/19/14   Kathie DikeHobson M Toby Breithaupt, PA-C  ibuprofen (ADVIL,MOTRIN) 600 MG tablet Take 1 tablet (600 mg total) by mouth 4 (four) times daily. Take with each meal and at bedtime. 07/19/14   Kathie DikeHobson M Hyden Soley, PA-C  naproxen (NAPROSYN) 500 MG tablet Take 1 tablet (500 mg total) by mouth 2 (two) times daily with a meal. Prn pain 02/11/13   Campbell Richesarolyn C Hoskins, NP   BP 90/77 mmHg  Pulse 68  Temp(Src) 97.8 F (36.6 C) (Oral)  Resp 18  Ht 5\' 11"  (1.803 m)  Wt 145 lb (65.772 kg)  BMI 20.23 kg/m2  SpO2 100%  LMP 05/19/2014 Physical Exam  Constitutional: She is oriented to person, place, and time. She appears well-developed and well-nourished.  Non-toxic appearance.  HENT:  Head: Normocephalic.    Right Ear: Tympanic membrane and external ear normal.  Left Ear: Tympanic membrane and external ear normal.  No red streaks or drainage of the abscess of the left face.  No facial weakness.  No oral mucosa involvement.  Eyes: EOM and lids are normal. Pupils are equal, round, and reactive to  light.  Neck: Normal range of motion. Neck supple. Carotid bruit is not present.  Cardiovascular: Normal rate, regular rhythm, normal heart sounds, intact distal pulses and normal pulses.   Pulmonary/Chest: Breath sounds normal. No respiratory distress.  Abdominal: Soft. Bowel sounds are normal. There is no tenderness. There is no guarding.  Musculoskeletal: Normal range of motion.  Lymphadenopathy:       Head (right side): No submandibular adenopathy present.       Head (left side): No submandibular adenopathy present.    She has no cervical adenopathy.  Neurological: She is alert and oriented to person, place, and time. She has normal strength. No cranial  nerve deficit or sensory deficit.  Skin: Skin is warm and dry.  Psychiatric: She has a normal mood and affect. Her speech is normal.  Nursing note and vitals reviewed.   ED Course  Procedures (including critical care time) Labs Review Labs Reviewed - No data to display  Imaging Review No results found.   EKG Interpretation None      MDM  Patient has an abscess of the left face at the lower jaw area. There is no red streaking appreciated. Is no current drainage noted. There is no cervical lymphadenopathy. There is a very small submental node present.  Vital signs are well within normal limits.  The plan at this time is for the patient to be treated with doxycycline, ibuprofen, and Norco for pain if needed. The patient is to return or see Dr. Lovell SheehanJenkins if not improving for possible drainage.    Final diagnoses:  Abscess of face    **I have reviewed nursing notes, vital signs, and all appropriate lab and imaging results for this patient.Kathie Dike*    Jaide Hillenburg M Darrek Leasure, PA-C 07/19/14 1115  Hilario Quarryanielle S Ray, MD 07/19/14 [phone]

## 2015-04-30 ENCOUNTER — Other Ambulatory Visit: Payer: Self-pay | Admitting: Obstetrics and Gynecology

## 2015-04-30 DIAGNOSIS — Z1231 Encounter for screening mammogram for malignant neoplasm of breast: Secondary | ICD-10-CM

## 2015-06-08 ENCOUNTER — Ambulatory Visit (HOSPITAL_COMMUNITY): Payer: Self-pay

## 2015-06-14 ENCOUNTER — Emergency Department (HOSPITAL_COMMUNITY): Payer: BLUE CROSS/BLUE SHIELD

## 2015-06-14 ENCOUNTER — Emergency Department (HOSPITAL_COMMUNITY)
Admission: EM | Admit: 2015-06-14 | Discharge: 2015-06-14 | Disposition: A | Discharge status: Home / Self Care | Payer: BLUE CROSS/BLUE SHIELD | Attending: Emergency Medicine | Admitting: Emergency Medicine

## 2015-06-14 ENCOUNTER — Encounter (HOSPITAL_COMMUNITY): Payer: Self-pay | Admitting: Emergency Medicine

## 2015-06-14 DIAGNOSIS — L03012 Cellulitis of left finger: Secondary | ICD-10-CM | POA: Diagnosis not present

## 2015-06-14 DIAGNOSIS — M79645 Pain in left finger(s): Secondary | ICD-10-CM | POA: Diagnosis present

## 2015-06-14 DIAGNOSIS — Z720 Tobacco use: Secondary | ICD-10-CM | POA: Diagnosis not present

## 2015-06-14 DIAGNOSIS — Z8742 Personal history of other diseases of the female genital tract: Secondary | ICD-10-CM | POA: Insufficient documentation

## 2015-06-14 MED ORDER — CLINDAMYCIN PHOSPHATE 300 MG/2ML IJ SOLN
300.0000 mg | Freq: Once | INTRAMUSCULAR | Status: AC
  Administered 2015-06-14: 300 mg via INTRAMUSCULAR
  Filled 2015-06-14: qty 2
  Filled 2015-06-14: qty 6

## 2015-06-14 MED ORDER — CLINDAMYCIN HCL 150 MG PO CAPS: 450.0000 mg | ORAL_CAPSULE | Freq: Four times a day (QID) | ORAL | Status: DC

## 2015-06-14 NOTE — ED Notes (Signed)
PT c/o red, swollen painful area to left hand 5th digit x2 days and denies any drainage from area or injury.

## 2015-06-14 NOTE — ED Notes (Signed)
Patient transported returned from X-ray

## 2015-06-14 NOTE — ED Provider Notes (Signed)
CSN: [id number]     Arrival date & time 06/14/15  1000 History   First MD Initiated Contact with Patient 06/14/15 1012     Chief Complaint  Patient presents with  . Cellulitis     (Consider location/radiation/quality/duration/timing/severity/associated sxs/prior Treatment) The history is provided by the patient.   Tina Galloway is a 49 y.o. right handed female presenting with a red, swollen, painful left 5th finger which started 2 days ago.  She endorses doing yard work and initially noticed  a small slightly raised, slightly itchy bump, which she felt may have been an insect bite which has become more painful, red and now with expanding swelling and redness.  There is been no drainage from the wound site, she denies radiation of pain beyond her fifth finger, has had no fevers or chills or other complaint.  She has had no medications prior to arrival for this condition.     Past Medical History  Diagnosis Date  . Abnormal Pap smear   . Menopausal symptoms 06/04/2013  . Vaginal Pap smear, abnormal    Past Surgical History  Procedure Laterality Date  . Tonsillectomy and adenoidectomy     Family History  Problem Relation Age of Onset  . Cancer Mother 4    breast    Social History  Substance Use Topics  . Smoking status: Light Tobacco Smoker    Types: Cigarettes  . Smokeless tobacco: Never Used     Comment: occ. smoker  . Alcohol Use: Yes     Comment: occasional   OB History    Gravida Para Term Preterm AB TAB SAB Ectopic Multiple Living   Review of Systems  Constitutional: Negative for fever.  Musculoskeletal: Positive for joint swelling and arthralgias. Negative for myalgias.  Skin: Positive for color change.  Neurological: Negative for weakness and numbness.      Allergies  Review of patient's allergies indicates no known allergies.  Home Medications   Prior to Admission medications   Medication Sig Start Date End Date Taking?  Authorizing Provider  clindamycin (CLEOCIN) 150 MG capsule Take 3 capsules (450 mg total) by mouth 4 (four) times daily. 06/14/15   Burgess Amor, PA-C   BP 112/70 mmHg  Pulse 68  Temp(Src) 98.3 F (36.8 C) (Oral)  Resp 18  Ht  (1.803 m)  Wt 145 lb (65.772 kg)  BMI 20.23 kg/m2  SpO2 99%  LMP 05/07/2015 Physical Exam  Constitutional: She appears well-developed and well-nourished.  HENT:  Head: Atraumatic.  Neck: Normal range of motion.  Cardiovascular:  Pulses equal bilaterally  Musculoskeletal: She exhibits edema and tenderness.  Tender to palpation left dorsal fifth finger involving the middle phalanx and PIP joint.  She does have range of motion although limited secondary to swelling.  Distal sensation is intact.  There is no red streaking.  There is a small area of scabbing mid dorsal middle phalanx.  Neurological: She is alert. She has normal strength. She displays normal reflexes. No sensory deficit.  Skin: Skin is warm and dry.  Psychiatric: She has a normal mood and affect.    ED Course  Procedures (including critical care time)  Imaging was done to rule out foreign body, there is no appreciable foreign body on exam as well as by x-ray.  An informal bedside ultrasound was also completed with no visible fluid pocket seen.  Patient was given a dose of IM  clindamycin prior to discharge home.   Labs Review Labs Reviewed - No data to display  Imaging Review Dg Finger Little Left  06/14/2015  CLINICAL DATA:  Fifth digit swollen and painful at PIP joint for 2 days. No known injury. EXAM: LEFT LITTLE FINGER 2+V COMPARISON:  None. FINDINGS: Osseous alignment is normal. Bone mineralization is normal. No fracture line or displaced fracture fragment seen. No acute -appearing cortical irregularity or osseous lesion. No erosions or other signs of an inflammatory arthritis. No evidence of degenerative osteoarthritis. Soft tissue swelling is noted about the proximal interphalangeal  joint space. Soft tissues otherwise unremarkable. IMPRESSION: Soft tissue swelling about the PIP joint. No foreign body identified. No evidence of soft tissue gas. No osseous abnormality. Electronically Signed   By: Bary RichardStan  Maynard M.D.   On: 06/14/2015 11:24   I have personally reviewed and evaluated these images and lab results as part of my medical decision-making.   EKG Interpretation None      MDM   Final diagnoses:  Cellulitis of finger of left hand    xrays reviewed and discussed with patient. Advised elevation, warm compresses, clindamcyin prescribed.  Advised close f/u with Dr Merlyn LotKuzma within the next 1-2 days, pt to call for appt.  In the interim, advised recheck here for any worsening sx.  Pt seen by Dr. Clarene DukeMcManus prior to dc home.  The patient appears reasonably screened and/or stabilized for discharge and I doubt any other medical condition or other [facility] requiring further screening, evaluation, or treatment in the ED at this time prior to discharge.     Burgess AmorJulie Yahsir Wickens, PA-C 06/14/15 1728  Samuel JesterKathleen McManus, DO 06/17/15 1425

## 2015-06-14 NOTE — Discharge Instructions (Signed)

## 2015-07-16 ENCOUNTER — Encounter: Payer: Self-pay | Admitting: Family Medicine

## 2015-07-16 ENCOUNTER — Ambulatory Visit (INDEPENDENT_AMBULATORY_CARE_PROVIDER_SITE_OTHER): Payer: BLUE CROSS/BLUE SHIELD | Admitting: Family Medicine

## 2015-07-16 VITALS — Temp 98.3°F | Ht 71.0 in | Wt 150.0 lb

## 2015-07-16 DIAGNOSIS — J069 Acute upper respiratory infection, unspecified: Secondary | ICD-10-CM | POA: Diagnosis not present

## 2015-07-16 DIAGNOSIS — J019 Acute sinusitis, unspecified: Secondary | ICD-10-CM | POA: Diagnosis not present

## 2015-07-16 DIAGNOSIS — B9689 Other specified bacterial agents as the cause of diseases classified elsewhere: Secondary | ICD-10-CM

## 2015-07-16 MED ORDER — AMOXICILLIN 500 MG PO TABS: 500.0000 mg | ORAL_TABLET | Freq: Three times a day (TID) | ORAL | Status: DC

## 2015-07-16 NOTE — Progress Notes (Signed)
   Subjective:    Patient ID: Tina MakoSusan L Edwards Sides, female    DOB: 1966-03-20, 49 y.o.   MRN: [id number]  Cough This is a new problem. The current episode started in the past 7 days. Associated symptoms include headaches and nasal congestion. Treatments tried: nyquil.   Patients had this for several days now with sinus pressure pain discomfort   Review of Systems  Respiratory: Positive for cough.   Neurological: Positive for headaches.       Objective:   Physical Exam  Sinus moderate tenderness neck supple lungs clear heart regular eardrums normal      Assessment & Plan:  Viral syndrome secondary rhinosinusitis antibiotics prescribed warning signs discussed follow-up if ongoing troubles

## 2015-08-06 ENCOUNTER — Ambulatory Visit (HOSPITAL_COMMUNITY): Payer: Self-pay

## 2015-08-14 ENCOUNTER — Ambulatory Visit (HOSPITAL_COMMUNITY)
Admission: RE | Admit: 2015-08-14 | Discharge: 2015-08-14 | Disposition: A | Discharge status: Home / Self Care | Payer: BLUE CROSS/BLUE SHIELD | Source: Ambulatory Visit | Attending: Obstetrics and Gynecology | Admitting: Obstetrics and Gynecology

## 2015-08-14 DIAGNOSIS — Z1231 Encounter for screening mammogram for malignant neoplasm of breast: Secondary | ICD-10-CM | POA: Diagnosis present

## 2015-08-31 ENCOUNTER — Other Ambulatory Visit: Payer: Self-pay | Admitting: Adult Health

## 2015-11-18 ENCOUNTER — Other Ambulatory Visit: Payer: BLUE CROSS/BLUE SHIELD | Admitting: Adult Health

## 2015-12-03 ENCOUNTER — Other Ambulatory Visit: Payer: BLUE CROSS/BLUE SHIELD | Admitting: Adult Health

## 2015-12-08 ENCOUNTER — Other Ambulatory Visit: Payer: BLUE CROSS/BLUE SHIELD | Admitting: Adult Health

## 2015-12-14 ENCOUNTER — Encounter: Payer: Self-pay | Admitting: Adult Health

## 2015-12-14 ENCOUNTER — Ambulatory Visit (INDEPENDENT_AMBULATORY_CARE_PROVIDER_SITE_OTHER): Payer: BLUE CROSS/BLUE SHIELD | Admitting: Adult Health

## 2015-12-14 ENCOUNTER — Other Ambulatory Visit (HOSPITAL_COMMUNITY)
Admission: RE | Admit: 2015-12-14 | Discharge: 2015-12-14 | Disposition: A | Discharge status: Home / Self Care | Payer: BLUE CROSS/BLUE SHIELD | Source: Ambulatory Visit | Attending: Adult Health | Admitting: Adult Health

## 2015-12-14 VITALS — BP 110/70 | HR 64 | Ht 68.25 in | Wt 150.5 lb

## 2015-12-14 DIAGNOSIS — Z01419 Encounter for gynecological examination (general) (routine) without abnormal findings: Secondary | ICD-10-CM

## 2015-12-14 DIAGNOSIS — N951 Menopausal and female climacteric states: Secondary | ICD-10-CM

## 2015-12-14 DIAGNOSIS — Z1151 Encounter for screening for human papillomavirus (HPV): Secondary | ICD-10-CM | POA: Insufficient documentation

## 2015-12-14 DIAGNOSIS — Z79890 Hormone replacement therapy (postmenopausal): Secondary | ICD-10-CM

## 2015-12-14 DIAGNOSIS — Z1212 Encounter for screening for malignant neoplasm of rectum: Secondary | ICD-10-CM

## 2015-12-14 HISTORY — DX: Hormone replacement therapy: Z79.890

## 2015-12-14 LAB — HEMOCCULT GUIAC POC 1CARD (OFFICE): Fecal Occult Blood, POC: NEGATIVE

## 2015-12-14 MED ORDER — CONJ ESTROGENS-BAZEDOXIFENE 0.45-20 MG PO TABS: ORAL_TABLET | ORAL | Status: DC

## 2015-12-14 NOTE — Patient Instructions (Signed)
Physical in 1 year Pap in 3 years if normal Mammogram yearly Take duavee 1 daily and follow up in 3 months and get labs then Menopause Menopause is the normal time of life when menstrual periods stop completely. Menopause is complete when you have missed 12 consecutive menstrual periods. It usually occurs between the ages of 48 years and 55 years. Very rarely does a woman develop menopause before the age of 40 years. At menopause, your ovaries stop producing the female hormones estrogen and progesterone. This can cause undesirable symptoms and also affect your health. Sometimes the symptoms may occur 4-5 years before the menopause begins. There is no relationship between menopause and:  Oral contraceptives.  Number of children you had.  Race.  The age your menstrual periods started (menarche). Heavy smokers and very thin women may develop menopause earlier in life. CAUSES  The ovaries stop producing the female hormones estrogen and progesterone.  Other causes include:  Surgery to remove both ovaries.  The ovaries stop functioning for no known reason.  Tumors of the pituitary gland in the brain.  Medical disease that affects the ovaries and hormone production.  Radiation treatment to the abdomen or pelvis.  Chemotherapy that affects the ovaries. SYMPTOMS   Hot flashes.  Night sweats.  Decrease in sex drive.  Vaginal dryness and thinning of the vagina causing painful intercourse.  Dryness of the skin and developing wrinkles.  Headaches.  Tiredness.  Irritability.  Memory problems.  Weight gain.  Bladder infections.  Hair growth of the face and chest.  Infertility. More serious symptoms include:  Loss of bone (osteoporosis) causing breaks (fractures).  Depression.  Hardening and narrowing of the arteries (atherosclerosis) causing heart attacks and strokes. DIAGNOSIS   When the menstrual periods have stopped for 12 straight months.  Physical  exam.  Hormone studies of the blood. TREATMENT  There are many treatment choices and nearly as many questions about them. The decisions to treat or not to treat menopausal changes is an individual choice made with your health care provider. Your health care provider can discuss the treatments with you. Together, you can decide which treatment will work best for you. Your treatment choices may include:   Hormone therapy (estrogen and progesterone).  Non-hormonal medicines.  Treating the individual symptoms with medicine (for example antidepressants for depression).  Herbal medicines that may help specific symptoms.  Counseling by a psychiatrist or psychologist.  Group therapy.  Lifestyle changes including:  Eating healthy.  Regular exercise.  Limiting caffeine and alcohol.  Stress management and meditation.  No treatment. HOME CARE INSTRUCTIONS   Take the medicine your health care provider gives you as directed.  Get plenty of sleep and rest.  Exercise regularly.  Eat a diet that contains calcium (good for the bones) and soy products (acts like estrogen hormone).  Avoid alcoholic beverages.  Do not smoke.  If you have hot flashes, dress in layers.  Take supplements, calcium, and vitamin D to strengthen bones.  You can use over-the-counter lubricants or moisturizers for vaginal dryness.  Group therapy is sometimes very helpful.  Acupuncture may be helpful in some cases. SEEK MEDICAL CARE IF:   You are not sure you are in menopause.  You are having menopausal symptoms and need advice and treatment.  You are still having menstrual periods after age 55 years.  You have pain with intercourse.  Menopause is complete (no menstrual period for 12 months) and you develop vaginal bleeding.  You need a referral to  a specialist (gynecologist, psychiatrist, or psychologist) for treatment. SEEK IMMEDIATE MEDICAL CARE IF:   You have severe depression.  You have  excessive vaginal bleeding.  You fell and think you have a broken bone.  You have pain when you urinate.  You develop leg or chest pain.  You have a fast pounding heart beat (palpitations).  You have severe headaches.  You develop vision problems.  You feel a lump in your breast.  You have abdominal pain or severe indigestion.   This information is not intended to replace advice given to you by your health care provider. Make sure you discuss any questions you have with your health care provider.   Document Released: 10/22/2003 Document Revised: 04/03/2013 Document Reviewed: 02/28/2013 Elsevier Interactive Patient Education [employer]2016 Elsevier Inc.

## 2015-12-14 NOTE — Progress Notes (Signed)
Patient ID: Linus MakoSusan L Galloway Galloway, female   DOB: [birth year], 50 y.o.   MRN: [id number] History of Present Illness: Tina Galloway is a 50 year old white female, in for a well woman gyn exam and pap and is complaining of hot flashes, irregular periods and decreased libido and moody at times.   Current Medications, Allergies, Past Medical History, Past Surgical History, Family History and Social History were reviewed in [facility] electronic medical record.     Review of Systems: Patient denies any headaches, hearing loss, fatigue, blurred vision, shortness of breath, chest pain, abdominal pain, problems with bowel movements, urination, or intercourse. No joint pain, see HPI for positives.    Physical Exam:BP 110/70 mmHg  Pulse 64  Ht 5' 8.25" (1.734 m)  Wt 150 lb 8 oz (68.266 kg)  BMI 22.70 kg/m2  LMP 10/31/2015 General:  Well developed, well nourished, no acute distress Skin:  Warm and dry Neck:  Midline trachea, normal thyroid, good ROM, no lymphadenopathy Lungs; Clear to auscultation bilaterally Breast:  No dominant palpable mass, retraction, or nipple discharge Cardiovascular: Regular rate and rhythm Abdomen:  Soft, non tender, no hepatosplenomegaly Pelvic:  External genitalia is normal in appearance, no lesions.  The vagina is normal in appearance. Urethra has no lesions or masses. The cervix is bulbous. Pap with HPV performed. Uterus is felt to be normal size, shape, and contour.  No adnexal masses or tenderness noted.Bladder is non tender, no masses felt. Rectal: Good sphincter tone, no polyps, or hemorrhoids felt.  Hemoccult negative. Extremities/musculoskeletal:  No swelling or varicosities noted, no clubbing or cyanosis Psych:  No mood changes, alert and cooperative,seems happy Discussed HRT, risks and benefits and she wants to try it,m if not better. May add testosterone.  Impression: Well woman gyn exam and pap Menopause symptoms HRT    Plan: Rx duavee #30 take 1 daily  with 3 refills, 2 sample packs given and discount card Follow up in 3 months for ROS and labs  Physical in 1 year, pap in 3 if normal Mammogram yearly Review handout on menopause

## 2015-12-15 LAB — CYTOLOGY - PAP

## 2016-01-06 ENCOUNTER — Encounter: Payer: Self-pay | Admitting: Adult Health

## 2016-01-06 ENCOUNTER — Other Ambulatory Visit: Payer: Self-pay | Admitting: Adult Health

## 2016-01-06 MED ORDER — ESTRADIOL 1 MG PO TABS: 1.0000 mg | ORAL_TABLET | Freq: Every day | ORAL | Status: DC

## 2016-01-06 MED ORDER — PROGESTERONE MICRONIZED 200 MG PO CAPS: ORAL_CAPSULE | ORAL | Status: DC

## 2016-01-07 ENCOUNTER — Encounter: Payer: Self-pay | Admitting: Adult Health

## 2016-02-18 ENCOUNTER — Ambulatory Visit (INDEPENDENT_AMBULATORY_CARE_PROVIDER_SITE_OTHER): Payer: BLUE CROSS/BLUE SHIELD | Admitting: Family Medicine

## 2016-02-18 ENCOUNTER — Encounter: Payer: Self-pay | Admitting: Family Medicine

## 2016-02-18 VITALS — BP 110/72 | Temp 98.1°F | Ht 71.0 in | Wt 148.2 lb

## 2016-02-18 DIAGNOSIS — B9689 Other specified bacterial agents as the cause of diseases classified elsewhere: Secondary | ICD-10-CM

## 2016-02-18 DIAGNOSIS — J019 Acute sinusitis, unspecified: Secondary | ICD-10-CM

## 2016-02-18 MED ORDER — AMOXICILLIN 500 MG PO TABS: 500.0000 mg | ORAL_TABLET | Freq: Three times a day (TID) | ORAL | Status: DC

## 2016-02-18 NOTE — Progress Notes (Signed)
   Subjective:    Patient ID: Linus MakoSusan L Edwards Sides, female    DOB: Jul 05, 1966, 50 y.o.   MRN: [id number]  Sinusitis This is a new problem. The current episode started in the past 7 days. There has been no fever. Associated symptoms include congestion, coughing, headaches and sinus pressure. Pertinent negatives include no ear pain or shortness of breath. (Runny nose, watering eyes) Treatments tried: Sudafed.   Patient states no other concerns this visit. PMH benign  Review of Systems  Constitutional: Negative for fever and activity change.  HENT: Positive for congestion, rhinorrhea and sinus pressure. Negative for ear pain.   Eyes: Negative for discharge.  Respiratory: Positive for cough. Negative for shortness of breath and wheezing.   Cardiovascular: Negative for chest pain.  Neurological: Positive for headaches.       Objective:   Physical Exam Moderate sinus tenderness of frontal maxillary sinuses eardrums normal throat normal neck supple lungs clear heart regular       Assessment & Plan:  Patient was seen today for upper respiratory illness. It is felt that the patient is dealing with sinusitis. Antibiotics were prescribed today. Importance of compliance with medication was discussed. Symptoms should gradually resolve over the course of the next several days. If high fevers, progressive illness, difficulty breathing, worsening condition or failure for symptoms to improve over the next several days then the patient is to follow-up. If any emergent conditions the patient is to follow-up in the emergency department otherwise to follow-up in the office. I believe patient is having sinus infection probably related to recent allergies. We will go with amoxicillin 3 times daily for 10 days if it does not dramatically help her she will notify us and we will change to a different antibiotic

## 2016-02-29 ENCOUNTER — Other Ambulatory Visit: Payer: Self-pay | Admitting: *Deleted

## 2016-02-29 MED ORDER — ESTRADIOL 1 MG PO TABS: 1.0000 mg | ORAL_TABLET | Freq: Every day | ORAL | Status: DC

## 2016-03-15 ENCOUNTER — Ambulatory Visit: Payer: BLUE CROSS/BLUE SHIELD | Admitting: Adult Health

## 2016-06-03 ENCOUNTER — Ambulatory Visit (INDEPENDENT_AMBULATORY_CARE_PROVIDER_SITE_OTHER): Payer: BLUE CROSS/BLUE SHIELD | Admitting: Family Medicine

## 2016-06-03 ENCOUNTER — Encounter: Payer: Self-pay | Admitting: Family Medicine

## 2016-06-03 VITALS — BP 122/72 | Temp 98.3°F | Ht 71.0 in | Wt 151.4 lb

## 2016-06-03 DIAGNOSIS — B9689 Other specified bacterial agents as the cause of diseases classified elsewhere: Secondary | ICD-10-CM

## 2016-06-03 DIAGNOSIS — J019 Acute sinusitis, unspecified: Secondary | ICD-10-CM

## 2016-06-03 MED ORDER — AMOXICILLIN 500 MG PO TABS: 500.0000 mg | ORAL_TABLET | Freq: Three times a day (TID) | ORAL | 0 refills | Status: DC

## 2016-06-03 NOTE — Progress Notes (Signed)
   Subjective:    Patient ID: Tina Galloway, female    DOB: [birth year], 50 y.o.   MRN: [id number]  Sinusitis  This is a new problem. Associated symptoms include coughing and headaches. (Runny nose) Treatments tried: Sudafed    Patient with head congestion drainage sinus pressure not feeling good denies high fever chills sweats  States no other concerns this visit.  Review of Systems  Respiratory: Positive for cough.   Neurological: Positive for headaches.       Objective:   Physical Exam  Moderate sinus tenderness eardrums normal throat normal neck supple lungs clear heart regular      Assessment & Plan:  Patient was seen today for upper respiratory illness. It is felt that the patient is dealing with sinusitis. Antibiotics were prescribed today. Importance of compliance with medication was discussed. Symptoms should gradually resolve over the course of the next several days. If high fevers, progressive illness, difficulty breathing, worsening condition or failure for symptoms to improve over the next several days then the patient is to follow-up. If any emergent conditions the patient is to follow-up in the emergency department otherwise to follow-up in the office. Warning signs were discussed follow-up if problems

## 2016-07-05 ENCOUNTER — Other Ambulatory Visit: Payer: Self-pay | Admitting: Obstetrics and Gynecology

## 2016-07-05 DIAGNOSIS — Z1231 Encounter for screening mammogram for malignant neoplasm of breast: Secondary | ICD-10-CM

## 2016-08-29 ENCOUNTER — Ambulatory Visit (HOSPITAL_COMMUNITY): Payer: BLUE CROSS/BLUE SHIELD

## 2016-08-30 ENCOUNTER — Other Ambulatory Visit: Payer: Self-pay | Admitting: Obstetrics and Gynecology

## 2016-08-30 DIAGNOSIS — Z1231 Encounter for screening mammogram for malignant neoplasm of breast: Secondary | ICD-10-CM

## 2016-09-12 ENCOUNTER — Ambulatory Visit (HOSPITAL_COMMUNITY)
Admission: RE | Admit: 2016-09-12 | Discharge: 2016-09-12 | Disposition: A | Discharge status: Home / Self Care | Payer: BLUE CROSS/BLUE SHIELD | Source: Ambulatory Visit | Attending: Obstetrics and Gynecology | Admitting: Obstetrics and Gynecology

## 2016-09-12 ENCOUNTER — Encounter (HOSPITAL_COMMUNITY): Payer: Self-pay | Admitting: Radiology

## 2016-09-12 DIAGNOSIS — Z1231 Encounter for screening mammogram for malignant neoplasm of breast: Secondary | ICD-10-CM | POA: Diagnosis not present

## 2017-09-25 ENCOUNTER — Other Ambulatory Visit: Payer: Self-pay | Admitting: Obstetrics and Gynecology

## 2017-09-25 DIAGNOSIS — Z1231 Encounter for screening mammogram for malignant neoplasm of breast: Secondary | ICD-10-CM

## 2017-10-09 ENCOUNTER — Ambulatory Visit (HOSPITAL_COMMUNITY): Payer: BLUE CROSS/BLUE SHIELD

## 2017-10-20 ENCOUNTER — Encounter (HOSPITAL_COMMUNITY): Payer: Self-pay

## 2017-10-20 ENCOUNTER — Ambulatory Visit (HOSPITAL_COMMUNITY)
Admission: RE | Admit: 2017-10-20 | Discharge: 2017-10-20 | Disposition: A | Discharge status: Home / Self Care | Payer: BLUE CROSS/BLUE SHIELD | Source: Ambulatory Visit | Attending: Obstetrics and Gynecology | Admitting: Obstetrics and Gynecology

## 2017-10-20 DIAGNOSIS — Z1231 Encounter for screening mammogram for malignant neoplasm of breast: Secondary | ICD-10-CM | POA: Diagnosis present

## 2018-01-24 ENCOUNTER — Encounter: Payer: Self-pay | Admitting: Adult Health

## 2018-01-24 ENCOUNTER — Other Ambulatory Visit: Payer: Self-pay

## 2018-01-24 ENCOUNTER — Ambulatory Visit (INDEPENDENT_AMBULATORY_CARE_PROVIDER_SITE_OTHER): Payer: BLUE CROSS/BLUE SHIELD | Admitting: Adult Health

## 2018-01-24 VITALS — BP 119/70 | HR 68 | Ht 70.0 in | Wt 157.0 lb

## 2018-01-24 DIAGNOSIS — R635 Abnormal weight gain: Secondary | ICD-10-CM | POA: Diagnosis not present

## 2018-01-24 DIAGNOSIS — N951 Menopausal and female climacteric states: Secondary | ICD-10-CM

## 2018-01-24 DIAGNOSIS — K5900 Constipation, unspecified: Secondary | ICD-10-CM

## 2018-01-24 DIAGNOSIS — R232 Flushing: Secondary | ICD-10-CM | POA: Diagnosis not present

## 2018-01-24 DIAGNOSIS — R140 Abdominal distension (gaseous): Secondary | ICD-10-CM | POA: Diagnosis not present

## 2018-01-24 MED ORDER — ESTRADIOL-NORETHINDRONE ACET 0.05-0.25 MG/DAY TD PTTW: 1.0000 | MEDICATED_PATCH | TRANSDERMAL | 12 refills | Status: DC

## 2018-01-24 NOTE — Patient Instructions (Signed)
Menopause Menopause is the normal time of life when menstrual periods stop completely. Menopause is complete when you have missed 12 consecutive menstrual periods. It usually occurs between the ages of 48 years and 55 years. Very rarely does a woman develop menopause before the age of 40 years. At menopause, your ovaries stop producing the female hormones estrogen and progesterone. This can cause undesirable symptoms and also affect your health. Sometimes the symptoms may occur 4-5 years before the menopause begins. There is no relationship between menopause and:  Oral contraceptives.  Number of children you had.  Race.  The age your menstrual periods started (menarche).  Heavy smokers and very thin women may develop menopause earlier in life. What are the causes?  The ovaries stop producing the female hormones estrogen and progesterone. Other causes include:  Surgery to remove both ovaries.  The ovaries stop functioning for no known reason.  Tumors of the pituitary gland in the brain.  Medical disease that affects the ovaries and hormone production.  Radiation treatment to the abdomen or pelvis.  Chemotherapy that affects the ovaries.  What are the signs or symptoms?  Hot flashes.  Night sweats.  Decrease in sex drive.  Vaginal dryness and thinning of the vagina causing painful intercourse.  Dryness of the skin and developing wrinkles.  Headaches.  Tiredness.  Irritability.  Memory problems.  Weight gain.  Bladder infections.  Hair growth of the face and chest.  Infertility. More serious symptoms include:  Loss of bone (osteoporosis) causing breaks (fractures).  Depression.  Hardening and narrowing of the arteries (atherosclerosis) causing heart attacks and strokes.  How is this diagnosed?  When the menstrual periods have stopped for 12 straight months.  Physical exam.  Hormone studies of the blood. How is this treated? There are many treatment  choices and nearly as many questions about them. The decisions to treat or not to treat menopausal changes is an individual choice made with your health care provider. Your health care provider can discuss the treatments with you. Together, you can decide which treatment will work best for you. Your treatment choices may include:  Hormone therapy (estrogen and progesterone).  Non-hormonal medicines.  Treating the individual symptoms with medicine (for example antidepressants for depression).  Herbal medicines that may help specific symptoms.  Counseling by a psychiatrist or psychologist.  Group therapy.  Lifestyle changes including: ? Eating healthy. ? Regular exercise. ? Limiting caffeine and alcohol. ? Stress management and meditation.  No treatment.  Follow these instructions at home:  Take the medicine your health care provider gives you as directed.  Get plenty of sleep and rest.  Exercise regularly.  Eat a diet that contains calcium (good for the bones) and soy products (acts like estrogen hormone).  Avoid alcoholic beverages.  Do not smoke.  If you have hot flashes, dress in layers.  Take supplements, calcium, and vitamin D to strengthen bones.  You can use over-the-counter lubricants or moisturizers for vaginal dryness.  Group therapy is sometimes very helpful.  Acupuncture may be helpful in some cases. Contact a health care provider if:  You are not sure you are in menopause.  You are having menopausal symptoms and need advice and treatment.  You are still having menstrual periods after age 55 years.  You have pain with intercourse.  Menopause is complete (no menstrual period for 12 months) and you develop vaginal bleeding.  You need a referral to a specialist (gynecologist, psychiatrist, or psychologist) for treatment. Get help right   away if:  You have severe depression.  You have excessive vaginal bleeding.  You fell and think you have a  broken bone.  You have pain when you urinate.  You develop leg or chest pain.  You have a fast pounding heart beat (palpitations).  You have severe headaches.  You develop vision problems.  You feel a lump in your breast.  You have abdominal pain or severe indigestion. This information is not intended to replace advice given to you by your health care provider. Make sure you discuss any questions you have with your health care provider. Document Released: 10/22/2003 Document Revised: 01/07/2016 Document Reviewed: 02/28/2013 Elsevier Interactive Patient Education  2017 Elsevier Inc.  

## 2018-01-24 NOTE — Progress Notes (Signed)
  Subjective:     Patient ID: Tina MakoSusan L Edwards Galloway, female   DOB: 1966-02-17, 52 y.o.   MRN: [id number]  HPI Tina Galloway is a 52 year old white female, married, G2P2,in complaining of hot flashes, not sleeping well, bloating, no period in over a year, weight gain and constipation. She has tried duavee and that did not help and then estradiol and Prometrium but stopped that and can't remember why.She is very active and works out regularly.  [facility] is [facility].  Review of Systems No period in over a year +hot flashes +weight gain +constipation Trouble sleeping at night +bloating Reviewed past medical,surgical, social and family history. Reviewed medications and allergies.     Objective:   Physical Exam BP 119/70 (BP Location: Right Arm, Patient Position: Sitting, Cuff Size: Normal)   Pulse 68   Ht 5\' 10"  (1.778 m)   Wt 157 lb (71.2 kg)   LMP  (LMP Unknown)   BMI 22.53 kg/m  Skin warm and dry. Lungs: clear to ausculation bilaterally. Cardiovascular: regular rate and rhythm. Will check labs, and will try a hormone patch, to see if helps with her symptoms.     Assessment:     1. Menopausal symptoms   2. Weight gain   3. Hot flashes       Plan:     Check CBC,CMP,TSH and FSH Meds ordered this encounter  Medications  . estradiol-norethindrone (COMBIPATCH) 0.05-0.25 MG/DAY    Sig: Place 1 patch onto the skin 2 (two) times a week.    Dispense:  8 patch    Refill:  12    Order Specific Question:   Supervising Provider    Answer:   Duane LopeEURE, LUTHER H [[id number]]  Return in 8 weeks for physical  Review handout on menopause

## 2018-01-25 LAB — COMPREHENSIVE METABOLIC PANEL
ALT: 15 IU/L (ref 0–32)
AST: 24 IU/L (ref 0–40)
Albumin/Globulin Ratio: 2 (ref 1.2–2.2)
Albumin: 4.9 g/dL (ref 3.5–5.5)
Alkaline Phosphatase: 91 IU/L (ref 39–117)
BUN/Creatinine Ratio: 20 (ref 9–23)
BUN: 17 mg/dL (ref 6–24)
Bilirubin Total: 0.3 mg/dL (ref 0.0–1.2)
CO2: 26 mmol/L (ref 20–29)
Calcium: 10.3 mg/dL — ABNORMAL HIGH (ref 8.7–10.2)
Chloride: 106 mmol/L (ref 96–106)
Creatinine, Ser: 0.87 mg/dL (ref 0.57–1.00)
GFR calc non Af Amer: 78 mL/min/{1.73_m2} (ref >59)
GFR, EST AFRICAN AMERICAN: 90 mL/min/{1.73_m2} (ref >59)
Globulin, Total: 2.5 g/dL (ref 1.5–4.5)
Glucose: 91 mg/dL (ref 65–99)
Potassium: 4.1 mmol/L (ref 3.5–5.2)
Sodium: 144 mmol/L (ref 134–144)
TOTAL PROTEIN: 7.4 g/dL (ref 6.0–8.5)

## 2018-01-25 LAB — CBC
HEMOGLOBIN: 13.1 g/dL (ref 11.1–15.9)
Hematocrit: 38.9 % (ref 34.0–46.6)
MCH: 31.6 pg (ref 26.6–33.0)
MCHC: 33.7 g/dL (ref 31.5–35.7)
MCV: 94 fL (ref 79–97)
PLATELETS: 268 10*3/uL (ref 150–450)
RBC: 4.14 x10E6/uL (ref 3.77–5.28)
RDW: 12.7 % (ref 12.3–15.4)
WBC: 4.7 10*3/uL (ref 3.4–10.8)

## 2018-01-25 LAB — TSH: TSH: 0.983 u[IU]/mL (ref 0.450–4.500)

## 2018-01-25 LAB — FOLLICLE STIMULATING HORMONE: FSH: 42.1 m[IU]/mL

## 2018-03-05 ENCOUNTER — Encounter: Payer: Self-pay | Admitting: Adult Health

## 2018-03-11 ENCOUNTER — Other Ambulatory Visit: Payer: Self-pay | Admitting: Adult Health

## 2018-03-13 ENCOUNTER — Other Ambulatory Visit: Payer: Self-pay | Admitting: Adult Health

## 2018-03-13 DIAGNOSIS — N950 Postmenopausal bleeding: Secondary | ICD-10-CM

## 2018-03-14 ENCOUNTER — Ambulatory Visit (INDEPENDENT_AMBULATORY_CARE_PROVIDER_SITE_OTHER): Payer: BLUE CROSS/BLUE SHIELD

## 2018-03-14 DIAGNOSIS — N950 Postmenopausal bleeding: Secondary | ICD-10-CM | POA: Diagnosis not present

## 2018-03-14 NOTE — Progress Notes (Signed)
PELVIC US TA/TV:homogeneous anteverted uterus,wnl,EEC 3.7 mm,simple right ovarian cyst 1.6 x 1 x 1.3 cm,normal left ovary,no free fluid,no pain during ultrasound,ovaries appear mobile

## 2018-03-21 ENCOUNTER — Other Ambulatory Visit: Payer: BLUE CROSS/BLUE SHIELD | Admitting: Adult Health

## 2018-05-02 ENCOUNTER — Other Ambulatory Visit: Payer: BLUE CROSS/BLUE SHIELD | Admitting: Adult Health

## 2018-06-06 ENCOUNTER — Other Ambulatory Visit: Payer: BLUE CROSS/BLUE SHIELD | Admitting: Adult Health

## 2018-07-18 ENCOUNTER — Other Ambulatory Visit: Payer: BLUE CROSS/BLUE SHIELD | Admitting: Adult Health

## 2018-08-06 ENCOUNTER — Telehealth: Payer: BLUE CROSS/BLUE SHIELD | Admitting: Family Medicine

## 2018-08-06 DIAGNOSIS — J019 Acute sinusitis, unspecified: Secondary | ICD-10-CM

## 2018-08-06 MED ORDER — AZELASTINE HCL 0.1 % NA SOLN: 1.0000 | Freq: Two times a day (BID) | NASAL | 0 refills | Status: DC

## 2018-08-06 NOTE — Progress Notes (Signed)

## 2018-08-07 ENCOUNTER — Ambulatory Visit (INDEPENDENT_AMBULATORY_CARE_PROVIDER_SITE_OTHER): Payer: BLUE CROSS/BLUE SHIELD | Admitting: Family Medicine

## 2018-08-07 ENCOUNTER — Encounter: Payer: Self-pay | Admitting: Family Medicine

## 2018-08-07 VITALS — BP 118/80 | Temp 98.6°F | Ht 71.0 in | Wt 160.0 lb

## 2018-08-07 DIAGNOSIS — J329 Chronic sinusitis, unspecified: Secondary | ICD-10-CM | POA: Diagnosis not present

## 2018-08-07 MED ORDER — CEFDINIR 300 MG PO CAPS: 300.0000 mg | ORAL_CAPSULE | Freq: Two times a day (BID) | ORAL | 0 refills | Status: DC

## 2018-08-07 NOTE — Progress Notes (Signed)
   Subjective:    Patient ID: Tina Galloway, female    DOB: [birth year], 52 y.o.   MRN: [id number]  HPI  Patient is here today due to sinus pressure,sinus congestion,facial pain,and productive cough, on going for the last three days.She has been taking sudafed and Tylenol day and night.  Tuff y head anc cough and maxillary pain  Some ear pressur  Slight wheezin   Mild low gr fever   ofice job       Did not miss work    Review of Systems No headache, no major weight loss or weight gain, no chest pain no back pain abdominal pain no change in bowel habits complete ROS otherwise negative     Objective:   Physical Exam Alert, mild malaise. Hydration good Vitals stable. frontal/ maxillary tenderness evident positive nasal congestion. pharynx normal neck supple  lungs clear/no crackles or wheezes. heart regular in rhythm                   Assessment & Plan:  Impression rhinosinusitis likely post viral, discussed with patient. plan antibiotics prescribed. Questions answered. Symptomatic care discussed. warning signs discussed. WSL

## 2018-09-03 ENCOUNTER — Other Ambulatory Visit: Payer: Self-pay

## 2018-09-03 ENCOUNTER — Other Ambulatory Visit: Payer: Self-pay | Admitting: Adult Health

## 2018-09-03 ENCOUNTER — Ambulatory Visit (INDEPENDENT_AMBULATORY_CARE_PROVIDER_SITE_OTHER): Payer: BLUE CROSS/BLUE SHIELD | Admitting: Adult Health

## 2018-09-03 ENCOUNTER — Other Ambulatory Visit (HOSPITAL_COMMUNITY)
Admission: RE | Admit: 2018-09-03 | Discharge: 2018-09-03 | Disposition: A | Discharge status: Home / Self Care | Payer: BLUE CROSS/BLUE SHIELD | Source: Ambulatory Visit | Attending: Adult Health | Admitting: Adult Health

## 2018-09-03 ENCOUNTER — Encounter: Payer: Self-pay | Admitting: Adult Health

## 2018-09-03 VITALS — BP 119/74 | HR 61 | Resp 16 | Ht 70.0 in | Wt 162.0 lb

## 2018-09-03 DIAGNOSIS — Z1211 Encounter for screening for malignant neoplasm of colon: Secondary | ICD-10-CM

## 2018-09-03 DIAGNOSIS — Z1212 Encounter for screening for malignant neoplasm of rectum: Secondary | ICD-10-CM | POA: Diagnosis not present

## 2018-09-03 DIAGNOSIS — Z01419 Encounter for gynecological examination (general) (routine) without abnormal findings: Secondary | ICD-10-CM | POA: Insufficient documentation

## 2018-09-03 LAB — HEMOCCULT GUIAC POC 1CARD (OFFICE): Fecal Occult Blood, POC: NEGATIVE

## 2018-09-03 NOTE — Progress Notes (Signed)
Patient ID: Tina Galloway, female   DOB: 07/20/1966, 53 y.o.   MRN: [id number] History of Present Illness: Tina Galloway is a 53 year old white female, married, PM in for a well woman gyn exam and pap. She stopped HRT patch is doing well without it.  PCP is [employer].   Current Medications, Allergies, Past Medical History, Past Surgical History, Family History and Social History were reviewed in [facility] record.     Review of Systems: Patient denies any headaches, hearing loss, fatigue, blurred vision, shortness of breath, chest pain, abdominal pain, problems with bowel movements, urination, or intercourse. No joint pain or mood swings. She exercises 5-6 x weekly, and may smoke occasionally.    Physical Exam:BP 119/74 (BP Location: Right Arm, Patient Position: Sitting, Cuff Size: Normal)   Pulse 61   Resp 16   Ht 5\' 10"  (1.778 m)   Wt 162 lb (73.5 kg)   LMP  (LMP Unknown)   BMI 23.24 kg/m  General:  Well developed, well nourished, no acute distress Skin:  Warm and dry Neck:  Midline trachea, normal thyroid, good ROM, no lymphadenopathy Lungs; Clear to auscultation bilaterally Breast:  No dominant palpable mass, retraction, or nipple discharge Cardiovascular: Regular rate and rhythm Abdomen:  Soft, non tender, no hepatosplenomegaly Pelvic:  External genitalia is normal in appearance, no lesions.  The vagina is normal in appearance. Urethra has no lesions or masses. The cervix is smooth, pap with HPV performed. Uterus is felt to be normal size, shape, and contour.  No adnexal masses or tenderness noted.Bladder is non tender, no masses felt. Rectal: Good sphincter tone, no polyps, or hemorrhoids felt.  Hemoccult negative. Extremities/musculoskeletal:  No swelling or varicosities noted, no clubbing or cyanosis Psych:  No mood changes, alert and cooperative,seems happy PHQ 2 score 0. Fall risk is low. Examination chaperoned by Francene Finders  RN.  Impression: 1. Encounter for gynecological examination with Papanicolaou smear of cervix   2. Screening for colorectal cancer       Plan: Check CBC,CMP,TSH and lipids Mammogram yearly Physical in 1 year Pap in 3 if normal Referred to Dr Jena Gauss for screening colonoscopy

## 2018-09-04 LAB — COMPREHENSIVE METABOLIC PANEL
ALBUMIN: 4.9 g/dL — AB (ref 3.8–4.8)
ALT: 15 IU/L (ref 0–32)
AST: 21 IU/L (ref 0–40)
Albumin/Globulin Ratio: 2.1 (ref 1.2–2.2)
Alkaline Phosphatase: 81 IU/L (ref 39–117)
BILIRUBIN TOTAL: 0.3 mg/dL (ref 0.0–1.2)
BUN/Creatinine Ratio: 16 (ref 9–23)
BUN: 13 mg/dL (ref 6–24)
CALCIUM: 9.7 mg/dL (ref 8.7–10.2)
CO2: 23 mmol/L (ref 20–29)
Chloride: 105 mmol/L (ref 96–106)
Creatinine, Ser: 0.79 mg/dL (ref 0.57–1.00)
GFR, EST AFRICAN AMERICAN: 101 mL/min/{1.73_m2} (ref >59)
GFR, EST NON AFRICAN AMERICAN: 88 mL/min/{1.73_m2} (ref >59)
Globulin, Total: 2.3 g/dL (ref 1.5–4.5)
Glucose: 89 mg/dL (ref 65–99)
Potassium: 4.5 mmol/L (ref 3.5–5.2)
Sodium: 144 mmol/L (ref 134–144)
TOTAL PROTEIN: 7.2 g/dL (ref 6.0–8.5)

## 2018-09-04 LAB — LIPID PANEL
CHOL/HDL RATIO: 2.1 ratio (ref 0.0–4.4)
Cholesterol, Total: 201 mg/dL — ABNORMAL HIGH (ref 100–199)
HDL: 98 mg/dL (ref >39)
LDL Calculated: 92 mg/dL (ref 0–99)
Triglycerides: 54 mg/dL (ref 0–149)
VLDL Cholesterol Cal: 11 mg/dL (ref 5–40)

## 2018-09-04 LAB — CBC
HEMATOCRIT: 38.6 % (ref 34.0–46.6)
Hemoglobin: 13.2 g/dL (ref 11.1–15.9)
MCH: 31.7 pg (ref 26.6–33.0)
MCHC: 34.2 g/dL (ref 31.5–35.7)
MCV: 93 fL (ref 79–97)
Platelets: 232 10*3/uL (ref 150–450)
RBC: 4.17 x10E6/uL (ref 3.77–5.28)
RDW: 11.7 % (ref 11.7–15.4)
WBC: 6.3 10*3/uL (ref 3.4–10.8)

## 2018-09-04 LAB — TSH: TSH: 1.03 u[IU]/mL (ref 0.450–4.500)

## 2018-09-05 LAB — CYTOLOGY - PAP
Diagnosis: NEGATIVE
HPV (WINDOPATH): NOT DETECTED

## 2018-09-14 ENCOUNTER — Encounter: Payer: Self-pay | Admitting: Internal Medicine

## 2018-10-16 ENCOUNTER — Ambulatory Visit: Payer: BLUE CROSS/BLUE SHIELD

## 2018-10-31 ENCOUNTER — Ambulatory Visit: Payer: BLUE CROSS/BLUE SHIELD

## 2019-02-18 ENCOUNTER — Other Ambulatory Visit (HOSPITAL_COMMUNITY): Payer: Self-pay | Admitting: Adult Health

## 2019-02-18 DIAGNOSIS — Z1231 Encounter for screening mammogram for malignant neoplasm of breast: Secondary | ICD-10-CM

## 2019-03-07 ENCOUNTER — Other Ambulatory Visit: Payer: Self-pay

## 2019-03-07 ENCOUNTER — Ambulatory Visit (HOSPITAL_COMMUNITY)
Admission: RE | Admit: 2019-03-07 | Discharge: 2019-03-07 | Disposition: A | Discharge status: Home / Self Care | Payer: BC Managed Care – PPO | Source: Ambulatory Visit | Attending: Adult Health | Admitting: Adult Health

## 2019-03-07 DIAGNOSIS — Z1231 Encounter for screening mammogram for malignant neoplasm of breast: Secondary | ICD-10-CM | POA: Insufficient documentation

## 2019-03-13 ENCOUNTER — Other Ambulatory Visit: Payer: Self-pay

## 2019-03-13 ENCOUNTER — Ambulatory Visit (INDEPENDENT_AMBULATORY_CARE_PROVIDER_SITE_OTHER): Payer: Self-pay | Admitting: *Deleted

## 2019-03-13 DIAGNOSIS — Z1211 Encounter for screening for malignant neoplasm of colon: Secondary | ICD-10-CM

## 2019-03-13 MED ORDER — NA SULFATE-K SULFATE-MG SULF 17.5-3.13-1.6 GM/177ML PO SOLN: 1.0000 | Freq: Once | ORAL | 0 refills | Status: AC

## 2019-03-13 NOTE — Patient Instructions (Signed)
[place]  September 22, 1965 MRN: [id number]     Procedure Date: 04/12/2019 Time to register: 8:30 am Place to register: [facility] Procedure Time: 9:30 am Scheduled provider: Dr. Oneida Alar    PREPARATION FOR COLONOSCOPY WITH SUPREP BOWEL PREP KIT  Note: Suprep Bowel Prep Kit is a split-dose (2day) regimen. Consumption of BOTH 6-ounce bottles is required for a complete prep.  Please notify us immediately if you are diabetic, take iron supplements, or if you are on Coumadin or any other blood thinners.                                                                                                                                                  1 DAY BEFORE PROCEDURE:  DATE: 04/11/2019   DAY: Thursday Continue clear liquids the entire day - NO SOLID FOOD.    At 6:00pm: Complete steps 1 through 4 below, using ONE (1) 6-ounce bottle, before going to bed. Step 1:  Pour ONE (1) 6-ounce bottle of SUPREP liquid into the mixing container.  Step 2:  Add cool drinking water to the 16 ounce line on the container and mix.  Note: Dilute the solution concentrate as directed prior to use. Step 3:  DRINK ALL the liquid in the container. Step 4:  You MUST drink an additional two (2) or more 16 ounce containers of water over the next one (1) hour.   Continue clear liquids.  DAY OF PROCEDURE:   DATE: 04/12/2019  DAY: Friday If you take medications for your heart, blood pressure, or breathing, you may take these medications.    5 hours before your procedure at :  4:30 am Step 1:  Pour ONE (1) 6-ounce bottle of SUPREP liquid into the mixing container.  Step 2:  Add cool drinking water to the 16 ounce line on the container and mix.  Note: Dilute the solution concentrate as directed prior to use. Step 3:  DRINK ALL the liquid in the container. Step 4:  You MUST drink an additional two (2) or more 16 ounce containers of water over the next one (1) hour. You MUST complete the final glass  of water at least 3 hours before your colonoscopy. Nothing by mouth past 6:30 am  You may take your morning medications with sip of water unless we have instructed otherwise.    Please see below for Dietary Information.  CLEAR LIQUIDS INCLUDE:  Water Jello (NOT red in color)   Ice Popsicles (NOT red in color)   Tea (sugar ok, no milk/cream) Powdered fruit flavored drinks  Coffee (sugar ok, no milk/cream) Gatorade/ Lemonade/ Kool-Aid  (NOT red in color)   Juice: apple, white grape, white cranberry Soft drinks  Clear bullion, consomme, broth (fat free beef/chicken/vegetable)  Carbonated beverages (any kind)  Strained chicken noodle soup Hard Candy   Remember:  Clear liquids are liquids that will allow you to see your fingers on the other side of a clear glass. Be sure liquids are NOT red in color, and not cloudy, but CLEAR.  DO NOT EAT OR DRINK ANY OF THE FOLLOWING:  Dairy products of any kind   Cranberry juice Tomato juice / V8 juice   Grapefruit juice Orange juice     Red grape juice  Do not eat any solid foods, including such foods as: cereal, oatmeal, yogurt, fruits, vegetables, creamed soups, eggs, bread, crackers, pureed foods in a blender, etc.   HELPFUL HINTS FOR DRINKING PREP SOLUTION:   Make sure prep is extremely cold. Mix and refrigerate the the morning of the prep. You may also put in the freezer.   You may try mixing some Crystal Light or Country Time Lemonade if you prefer. Mix in small amounts; add more if necessary.  Try drinking through a straw  Rinse mouth with water or a mouthwash between glasses, to remove after-taste.  Try sipping on a cold beverage /ice/ popsicles between glasses of prep.  Place a piece of sugar-free hard candy in mouth between glasses.  If you become nauseated, try consuming smaller amounts, or stretch out the time between glasses. Stop for 30-60 minutes, then slowly start back drinking.     OTHER INSTRUCTIONS  You will need a  responsible adult at least 51 years of age to accompany you and drive you home. This person must remain in the waiting room during your procedure. The hospital will cancel your procedure if you do not have a responsible adult with you.   1. Wear loose fitting clothing that is easily removed. 2. Leave jewelry and other valuables at home.  3. Remove all body piercing jewelry and leave at home. 4. Total time from sign-in until discharge is approximately 2-3 hours. 5. You should go home directly after your procedure and rest. You can resume normal activities the day after your procedure. 6. The day of your procedure you should not:  Drive  Make legal decisions  Operate machinery  Drink alcohol  Return to work   You may call the office (Dept: [phone]) before 5:00pm, or page the doctor on call [phone]) after 5:00pm, for further instructions, if necessary.   Insurance Information YOU WILL NEED TO CHECK WITH YOUR INSURANCE COMPANY FOR THE BENEFITS OF COVERAGE YOU HAVE FOR THIS PROCEDURE.  UNFORTUNATELY, NOT ALL INSURANCE COMPANIES HAVE BENEFITS TO COVER ALL OR PART OF THESE TYPES OF PROCEDURES.  IT IS YOUR RESPONSIBILITY TO CHECK YOUR BENEFITS, HOWEVER, WE WILL BE GLAD TO ASSIST YOU WITH ANY CODES YOUR INSURANCE COMPANY MAY NEED.    PLEASE NOTE THAT MOST INSURANCE COMPANIES WILL NOT COVER A SCREENING COLONOSCOPY FOR PEOPLE UNDER THE AGE OF 50  IF YOU HAVE BCBS INSURANCE, YOU MAY HAVE BENEFITS FOR A SCREENING COLONOSCOPY BUT IF POLYPS ARE FOUND THE DIAGNOSIS WILL CHANGE AND THEN YOU MAY HAVE A DEDUCTIBLE THAT WILL NEED TO BE MET. SO PLEASE MAKE SURE YOU CHECK YOUR BENEFITS FOR A SCREENING COLONOSCOPY AS WELL AS A DIAGNOSTIC COLONOSCOPY.

## 2019-03-13 NOTE — Progress Notes (Addendum)
Gastroenterology Pre-Procedure Review  Request Date: 03/13/2019 Requesting Physician: Derrek Monaco, NP @ [facility], No previous TCS  PATIENT REVIEW QUESTIONS: The patient responded to the following health history questions as indicated:    1. Diabetes Melitis: No 2. Joint replacements in the past 12 months: No 3. Major health problems in the past 3 months: No 4. Has an artificial valve or MVP: No 5. Has a defibrillator: No 6. Has been advised in past to take antibiotics in advance of a procedure like teeth cleaning: No 7. Family history of colon cancer: No  8. Alcohol Use: Yes,2 glasses of wine every other weekend 9. History of sleep apnea: No  10. History of coronary artery or other vascular stents placed within the last 12 months: No 11. History of any prior anesthesia complications: No    MEDICATIONS & ALLERGIES:    Patient reports the following regarding taking any blood thinners:   Plavix? No Aspirin? No Coumadin? No Brilinta? No Xarelto? No Eliquis? No Pradaxa? No Savaysa? No Effient? No  Patient confirms/reports the following medications:  Current Outpatient Medications  Medication Sig Dispense Refill  . magnesium 30 MG tablet Take 30 mg by mouth daily.     No current facility-administered medications for this visit.     Patient confirms/reports the following allergies:  No Known Allergies  No orders of the defined types were placed in this encounter.   AUTHORIZATION INFORMATION Primary Insurance: [place],  [place] #: [id number],  Group #: [id number] Pre-Cert / Josem Kaufmann required: Not required per [place] / Auth #: Ref# [id number]  Secondary Insurance: [place],  [place] #:[id number] ,  Group #: [id number] Pre-Cert / Auth required:Not required/ Double coverage   SCHEDULE INFORMATION: Procedure has been scheduled as follows:  Date: 04/12/2019, Time: 9:30 Location: [facility] with Dr. Oneida Alar  This Gastroenterology Pre-Precedure Review Form  is being routed to the following provider(s): Roseanne Kaufman, NP

## 2019-03-15 NOTE — Progress Notes (Signed)
Appropriate.

## 2019-03-18 NOTE — Addendum Note (Signed)
Addended by: Metro Kung on: 03/18/2019 09:30 AM   Modules accepted: Orders, SmartSet

## 2019-04-10 ENCOUNTER — Other Ambulatory Visit (HOSPITAL_COMMUNITY)
Admission: RE | Admit: 2019-04-10 | Discharge: 2019-04-10 | Disposition: A | Discharge status: Home / Self Care | Payer: BC Managed Care – PPO | Source: Ambulatory Visit | Attending: Gastroenterology | Admitting: Gastroenterology

## 2019-04-10 ENCOUNTER — Other Ambulatory Visit: Payer: Self-pay

## 2019-04-10 DIAGNOSIS — Z01812 Encounter for preprocedural laboratory examination: Secondary | ICD-10-CM | POA: Insufficient documentation

## 2019-04-10 DIAGNOSIS — Z20828 Contact with and (suspected) exposure to other viral communicable diseases: Secondary | ICD-10-CM | POA: Insufficient documentation

## 2019-04-10 LAB — SARS CORONAVIRUS 2 (TAT 6-24 HRS): SARS Coronavirus 2: NEGATIVE

## 2019-04-12 ENCOUNTER — Encounter (HOSPITAL_COMMUNITY)
Admission: RE | Disposition: A | Discharge status: Home / Self Care | Payer: Self-pay | Source: Home / Self Care | Attending: Gastroenterology

## 2019-04-12 ENCOUNTER — Other Ambulatory Visit: Payer: Self-pay

## 2019-04-12 ENCOUNTER — Ambulatory Visit (HOSPITAL_COMMUNITY)
Admission: RE | Admit: 2019-04-12 | Discharge: 2019-04-12 | Disposition: A | Discharge status: Home / Self Care | Payer: BC Managed Care – PPO | Attending: Gastroenterology | Admitting: Gastroenterology

## 2019-04-12 ENCOUNTER — Encounter (HOSPITAL_COMMUNITY): Payer: Self-pay | Admitting: *Deleted

## 2019-04-12 DIAGNOSIS — Q438 Other specified congenital malformations of intestine: Secondary | ICD-10-CM | POA: Diagnosis not present

## 2019-04-12 DIAGNOSIS — K5730 Diverticulosis of large intestine without perforation or abscess without bleeding: Secondary | ICD-10-CM | POA: Insufficient documentation

## 2019-04-12 DIAGNOSIS — F17210 Nicotine dependence, cigarettes, uncomplicated: Secondary | ICD-10-CM | POA: Insufficient documentation

## 2019-04-12 DIAGNOSIS — K644 Residual hemorrhoidal skin tags: Secondary | ICD-10-CM | POA: Insufficient documentation

## 2019-04-12 DIAGNOSIS — Z79890 Hormone replacement therapy (postmenopausal): Secondary | ICD-10-CM | POA: Insufficient documentation

## 2019-04-12 DIAGNOSIS — K635 Polyp of colon: Secondary | ICD-10-CM | POA: Diagnosis not present

## 2019-04-12 DIAGNOSIS — Z1211 Encounter for screening for malignant neoplasm of colon: Secondary | ICD-10-CM | POA: Insufficient documentation

## 2019-04-12 DIAGNOSIS — K648 Other hemorrhoids: Secondary | ICD-10-CM | POA: Insufficient documentation

## 2019-04-12 HISTORY — PX: COLONOSCOPY: SHX5424

## 2019-04-12 HISTORY — PX: POLYPECTOMY: SHX5525

## 2019-04-12 SURGERY — COLONOSCOPY
Anesthesia: Moderate Sedation

## 2019-04-12 MED ORDER — MEPERIDINE HCL 100 MG/ML IJ SOLN
INTRAMUSCULAR | Status: DC | PRN
  Administered 2019-04-12 (×4): 25 mg

## 2019-04-12 MED ORDER — SODIUM CHLORIDE 0.9 % IV SOLN
INTRAVENOUS | Status: DC
  Administered 2019-04-12: 09:00:00 via INTRAVENOUS

## 2019-04-12 MED ORDER — MEPERIDINE HCL 100 MG/ML IJ SOLN
INTRAMUSCULAR | Status: AC
  Filled 2019-04-12: qty 1

## 2019-04-12 MED ORDER — MIDAZOLAM HCL 5 MG/5ML IJ SOLN
INTRAMUSCULAR | Status: DC | PRN
  Administered 2019-04-12 (×2): 2 mg via INTRAVENOUS
  Administered 2019-04-12: 1 mg via INTRAVENOUS

## 2019-04-12 MED ORDER — MIDAZOLAM HCL 5 MG/5ML IJ SOLN
INTRAMUSCULAR | Status: AC
  Filled 2019-04-12: qty 5

## 2019-04-12 NOTE — H&P (Signed)
Primary Care Physician:  Kathyrn Drown, MD Primary Gastroenterologist:  Dr. Oneida Alar  Pre-Procedure History & Physical: HPI:  Tina Galloway is a 53 y.o. female here for [place].  Past Medical History:  Diagnosis Date  . Abnormal Pap smear   . Hormone replacement therapy (HRT) 12/14/2015  . Menopausal symptoms 06/04/2013  . Vaginal Pap smear, abnormal     Past Surgical History:  Procedure Laterality Date  . TONSILLECTOMY AND ADENOIDECTOMY      Prior to Admission medications   Medication Sig Start Date End Date Taking? Authorizing Provider  ibuprofen (ADVIL) 200 MG tablet Take 800 mg by mouth every 8 (eight) hours as needed (PAIN.).   Yes [provider]  MAGNESIUM PO Take 1 tablet by mouth 3 (three) times a week.    [provider]    Allergies as of 03/18/2019  . (No Known Allergies)    Family History  Problem Relation Age of Onset  . Cancer Mother 75       breast     Social History   Socioeconomic History  . Marital status: Married    Spouse name: Not on file  . Number of children: Not on file  . Years of education: Not on file  . Highest education level: Not on file  Occupational History  . Not on file  Social Needs  . Financial resource strain: Not on file  . Food insecurity    Worry: Not on file    Inability: Not on file  . Transportation needs    Medical: Not on file    Non-medical: Not on file  Tobacco Use  . Smoking status: Light Tobacco Smoker    Years: 10.00    Types: Cigarettes  . Smokeless tobacco: Never Used  . Tobacco comment: socially smokes-on weekends  Substance and Sexual Activity  . Alcohol use: Yes    Comment: socially  . Drug use: No  . Sexual activity: Yes    Birth control/protection: Post-menopausal  Lifestyle  . Physical activity    Days per week: Not on file    Minutes per session: Not on file  . Stress: Not on file  Relationships  . Social Herbalist on phone: Not on  file    Gets together: Not on file    Attends religious service: Not on file    Active member of club or organization: Not on file    Attends meetings of clubs or organizations: Not on file    Relationship status: Not on file  . Intimate partner violence    Fear of current or ex partner: Not on file    Emotionally abused: Not on file    Physically abused: Not on file    Forced sexual activity: Not on file  Other Topics Concern  . Not on file  Social History Narrative  . Not on file    Review of Systems: See HPI, otherwise negative ROS   Physical Exam: Pulse 66   Temp 98.7 F (37.1 C) (Oral)   Resp 20   Ht 5\' 10"  (1.778 m)   Wt 68 kg   LMP  (LMP Unknown)   SpO2 99%   BMI 21.52 kg/m  General:   Alert,  pleasant and cooperative in NAD Head:  Normocephalic and atraumatic. Neck:  Supple; Lungs:  Clear throughout to auscultation.    Heart:  Regular rate and rhythm. Abdomen:  Soft, nontender and nondistended. Normal bowel sounds, without  guarding, and without rebound.   Neurologic:  Alert and  oriented x4;  grossly normal neurologically.  Impression/Plan:    SCREENING  Plan:  1. TCS TODAY DISCUSSED PROCEDURE, BENEFITS, & RISKS: < 1% chance of medication reaction, bleeding, perforation, ASPIRATION, or rupture of spleen/liver requiring surgery to fix it and missed polyps < 1 cm 10-20% of the time.

## 2019-04-12 NOTE — Op Note (Signed)
[facility] Patient Name: Tina Galloway Procedure Date: 04/12/2019 9:21 AM MRN: [id number] Date of Birth: June 17, 1966 Attending MD: Jonette Eva MD, MD CSN: [id number] Age: 53 Admit Type: Outpatient Procedure:                Colonoscopy WITH COLD SNARE POLYPECTOMY Indications:              Screening for colorectal malignant neoplasm Providers:                Jonette Eva MD, MD, Jannett Celestine, RN, Burke Keels, Technician Referring MD:             Jonna Coup. Luking Medicines:                Meperidine 100 mg IV, Midazolam 5 mg IV Complications:            No immediate complications. Estimated Blood Loss:     Estimated blood loss was minimal. Procedure:                Pre-Anesthesia Assessment:                           - Prior to the procedure, a History and Physical                            was performed, and patient medications and                            allergies were reviewed. The patient's tolerance of                            previous anesthesia was also reviewed. The risks                            and benefits of the procedure and the sedation                            options and risks were discussed with the patient.                            All questions were answered, and informed consent                            was obtained. Prior Anticoagulants: The patient has                            taken no previous anticoagulant or antiplatelet                            agents except for NSAID medication. ASA Grade                            Assessment: I - A normal, healthy patient. After  reviewing the risks and benefits, the patient was                            deemed in satisfactory condition to undergo the                            procedure. After obtaining informed consent, the                            colonoscope was passed under direct vision.                            Throughout the  procedure, the patient's blood                            pressure, pulse, and oxygen saturations were                            monitored continuously. The PCF-[id number] ([id number])                            was introduced through the anus and advanced to the                            the cecum, identified by appendiceal orifice and                            ileocecal valve. The colonoscopy was somewhat                            difficult due to a tortuous colon. Successful                            completion of the procedure was aided by                            straightening and shortening the scope to obtain                            bowel loop reduction and COLOWRAP. The patient                            tolerated the procedure well. The quality of the                            bowel preparation was good. The ileocecal valve,                            appendiceal orifice, and rectum were photographed. Scope In: 9:57:53 AM Scope Out: 10:13:50 AM Scope Withdrawal Time: 0 hours 13 minutes 17 seconds  Total Procedure Duration: 0 hours 15 minutes 57 seconds  Findings:      A 3 mm polyp was found in the sigmoid colon. The polyp was sessile. The  polyp was removed with a cold snare. Resection and retrieval were       complete.      Multiple small and large-mouthed diverticula were found in the       recto-sigmoid colon and sigmoid colon.      External and internal hemorrhoids were found.      The recto-sigmoid colon and sigmoid colon were mildly tortuous. Impression:               - One 3 mm polyp in the sigmoid colon, removed with                            a cold snare. Resected and retrieved.                           - Diverticulosis in the recto-sigmoid colon and in                            the sigmoid colon.                           - External and internal hemorrhoids.                           - Tortuous colon. Moderate Sedation:      Moderate (conscious) sedation  was administered by the endoscopy nurse       and supervised by the endoscopist. The following parameters were       monitored: oxygen saturation, heart rate, blood pressure, and response       to care. Total physician intraservice time was 32 minutes. Recommendation:           - Patient has a contact number available for                            emergencies. The signs and symptoms of potential                            delayed complications were discussed with the                            patient. Return to normal activities tomorrow.                            Written discharge instructions were provided to the                            patient.                           - High fiber diet.                           - Continue present medications.                           - Await pathology results.                           -  Repeat colonoscopy in 5-10 years for surveillance. Procedure Code(s):        --- Professional ---                           [id number], Colonoscopy, flexible; with removal of                            tumor(s), polyp(s), or other lesion(s) by snare                            technique                           99153, Moderate sedation; each additional 15                            minutes intraservice time                           G0500, Moderate sedation services provided by the                            same physician or other qualified health care                            professional performing a gastrointestinal                            endoscopic service that sedation supports,                            requiring the presence of an independent trained                            observer to assist in the monitoring of the                            patient's level of consciousness and physiological                            status; initial 15 minutes of intra-service time;                            patient age 31 years or older (additional time may                             be reported with [id number], as appropriate) Diagnosis Code(s):        --- Professional ---                           Z12.11, Encounter for screening for malignant                            neoplasm of colon  K63.5, Polyp of colon                           K64.8, Other hemorrhoids                           K57.30, Diverticulosis of large intestine without                            perforation or abscess without bleeding                           Q43.8, Other specified congenital malformations of                            intestine CPT copyright 2019 American Medical Association. All rights reserved. The codes documented in this report are preliminary and upon coder review may  be revised to meet current compliance requirements. Jonette EvaSandi Seven [place]ens, MD Jonette EvaSandi Clell Trahan MD, MD 04/12/2019 10:27:11 AM This report has been signed electronically. Number of Addenda: 0

## 2019-04-12 NOTE — Discharge Instructions (Signed)
You have small internal hemorrhoids and diverticulosis IN YOUR LEFT COLON. YOU HAD ONE SMALL POLYP REMOVED.    DRINK WATER TO KEEP YOUR URINE LIGHT YELLOW.  FOLLOW A HIGH FIBER DIET. AVOID ITEMS THAT CAUSE BLOATING. See info below.   USE PREPARATION H FOUR TIMES  A DAY IF NEEDED TO RELIEVE RECTAL PAIN/PRESSURE/BLEEDING.   YOUR BIOPSY RESULTS WILL BE BACK IN 5 BUSINESS DAYS.  Next colonoscopy in 5-10 years.  Colonoscopy Care After Read the instructions outlined below and refer to this sheet in the next week. These discharge instructions provide you with general information on caring for yourself after you leave the hospital. While your treatment has been planned according to the most current medical practices available, unavoidable complications occasionally occur. If you have any problems or questions after discharge, call DR. Chade Pitner, [phone].  ACTIVITY  You may resume your regular activity, but move at a slower pace for the next 24 hours.   Take frequent rest periods for the next 24 hours.   Walking will help get rid of the air and reduce the bloated feeling in your belly (abdomen).   No driving for 24 hours (because of the medicine (anesthesia) used during the test).   You may shower.   Do not sign any important legal documents or operate any machinery for 24 hours (because of the anesthesia used during the test).    NUTRITION  Drink plenty of fluids.   You may resume your normal diet as instructed by your doctor.   Begin with a light meal and progress to your normal diet. Heavy or fried foods are harder to digest and may make you feel sick to your stomach (nauseated).   Avoid alcoholic beverages for 24 hours or as instructed.    MEDICATIONS  You may resume your normal medications.   WHAT YOU CAN EXPECT TODAY  Some feelings of bloating in the abdomen.   Passage of more gas than usual.   Spotting of blood in your stool or on the toilet paper  .  IF YOU  HAD POLYPS REMOVED DURING THE COLONOSCOPY:  Eat a soft diet IF YOU HAVE NAUSEA, BLOATING, ABDOMINAL PAIN, OR VOMITING.    FINDING OUT THE RESULTS OF YOUR TEST Not all test results are available during your visit. DR. Darrick PennaFIELDS WILL CALL YOU WITHIN 14 DAYS OF YOUR PROCEDUE WITH YOUR RESULTS. Do not assume everything is normal if you have not heard from DR. Britlyn Martine, CALL HER OFFICE AT [phone].  SEEK IMMEDIATE MEDICAL ATTENTION AND CALL THE OFFICE: [phone] IF:  You have more than a spotting of blood in your stool.   Your belly is swollen (abdominal distention).   You are nauseated or vomiting.   You have a temperature over 101F.   You have abdominal pain or discomfort that is severe or gets worse throughout the day.  High-Fiber Diet A high-fiber diet changes your normal diet to include more whole grains, legumes, fruits, and vegetables. Changes in the diet involve replacing refined carbohydrates with unrefined foods. The calorie level of the diet is essentially unchanged. The Dietary Reference Intake (recommended amount) for adult males is 38 grams per day. For adult females, it is 25 grams per day. Pregnant and lactating women should consume 28 grams of fiber per day. Fiber is the intact part of a plant that is not broken down during digestion. Functional fiber is fiber that has been isolated from the plant to provide a beneficial effect in the body.  PURPOSE  Increase stool bulk.   Ease and regulate bowel movements.   Lower cholesterol.   REDUCE RISK OF COLON CANCER  INDICATIONS THAT YOU NEED MORE FIBER  Constipation and hemorrhoids.   Uncomplicated diverticulosis (intestine condition) and irritable bowel syndrome.   Weight management.   As a protective measure against hardening of the arteries (atherosclerosis), diabetes, and cancer.   GUIDELINES FOR INCREASING FIBER IN THE DIET  Start adding fiber to the diet slowly. A gradual increase of about 5 more grams (2  servings of most fruits or vegetables) per day is best. Too rapid an increase in fiber may result in constipation, flatulence, and bloating.   Drink enough water and fluids to keep your urine clear or pale yellow. Water, juice, or caffeine-free drinks are recommended. Not drinking enough fluid may cause constipation.   Eat a variety of high-fiber foods rather than one type of fiber.   Try to increase your intake of fiber through using high-fiber foods rather than fiber pills or supplements that contain small amounts of fiber.   The goal is to change the types of food eaten. Do not supplement your present diet with high-fiber foods, but replace foods in your present diet.     Polyps, Colon  A polyp is extra tissue that grows inside your body. Colon polyps grow in the large intestine. The large intestine, also called the colon, is part of your digestive system. It is a long, hollow tube at the end of your digestive tract where your body makes and stores stool. Most polyps are not dangerous. They are benign. This means they are not cancerous. But over time, some types of polyps can turn into cancer. Polyps that are smaller than a pea are usually not harmful. But larger polyps could someday become or may already be cancerous. To be safe, doctors remove all polyps and test them.   PREVENTION There is not one sure way to prevent polyps. You might be able to lower your risk of getting them if you:  Eat more fruits and vegetables and less fatty food.   Do not smoke.   Avoid alcohol.   Exercise every day.   Lose weight if you are overweight.   Eating more calcium and folate can also lower your risk of getting polyps. Some foods that are rich in calcium are milk, cheese, and broccoli. Some foods that are rich in folate are chickpeas, kidney beans, and spinach.    Diverticulosis Diverticulosis is a common condition that develops when small pouches (diverticula) form in the wall of the colon.  The risk of diverticulosis increases with age. It happens more often in people who eat a low-fiber diet. Most individuals with diverticulosis have no symptoms. Those individuals with symptoms usually experience belly (abdominal) pain, constipation, or loose stools (diarrhea).  HOME CARE INSTRUCTIONS  Increase the amount of fiber in your diet as directed by your caregiver or dietician. This may reduce symptoms of diverticulosis.   Drink at least 6 to 8 glasses of water each day to prevent constipation.   Try not to strain when you have a bowel movement.   Avoiding nuts and seeds to prevent complications is NOT NECESSARY.   FOODS HAVING HIGH FIBER CONTENT INCLUDE:  Fruits. Apple, peach, pear, tangerine, raisins, prunes.   Vegetables. Brussels sprouts, asparagus, broccoli, cabbage, carrot, cauliflower, romaine lettuce, spinach, summer squash, tomato, winter squash, zucchini.   Starchy Vegetables. Baked beans, kidney beans, lima beans, split peas, lentils, potatoes (with skin).   SEEK  IMMEDIATE MEDICAL CARE IF:  You develop increasing pain or severe bloating.   You have an oral temperature above 101F.   You develop vomiting or bowel movements that are bloody or black.

## 2019-04-16 ENCOUNTER — Encounter (HOSPITAL_COMMUNITY): Payer: Self-pay | Admitting: Gastroenterology

## 2019-04-17 NOTE — Progress Notes (Signed)
ON RECALL AND CC'D TO PCP °

## 2019-10-31 ENCOUNTER — Ambulatory Visit: Payer: BC Managed Care – PPO

## 2019-11-08 ENCOUNTER — Ambulatory Visit: Payer: BC Managed Care – PPO

## 2019-11-15 ENCOUNTER — Ambulatory Visit: Payer: BC Managed Care – PPO | Attending: Internal Medicine

## 2019-11-15 DIAGNOSIS — Z23 Encounter for immunization: Secondary | ICD-10-CM

## 2019-11-15 NOTE — Progress Notes (Signed)
   Covid-19 Vaccination Clinic  Name:  Tina Galloway    MRN: [id number] DOB: 12/12/1965  11/15/2019  Ms. Tina Galloway was observed post Covid-19 immunization for 15 minutes without incident. She was provided with Vaccine Information Sheet and instruction to access the V-Safe system.   Ms. Tina Galloway was instructed to call 911 with any severe reactions post vaccine: Marland Kitchen Difficulty breathing  . Swelling of face and throat  . A fast heartbeat  . A bad rash all over body  . Dizziness and weakness   Immunizations Administered    Name Date Dose VIS Date Route   Moderna COVID-19 Vaccine 11/15/2019 11:57 AM 0.5 mL 07/16/2019 Intramuscular   Manufacturer: Moderna   Lot: [id number]   NDC: 58316-742-55

## 2019-12-03 ENCOUNTER — Telehealth: Payer: BC Managed Care – PPO | Admitting: Family Medicine

## 2019-12-10 ENCOUNTER — Telehealth (INDEPENDENT_AMBULATORY_CARE_PROVIDER_SITE_OTHER): Payer: BC Managed Care – PPO | Admitting: Family Medicine

## 2019-12-10 ENCOUNTER — Telehealth: Payer: Self-pay | Admitting: Family Medicine

## 2019-12-10 ENCOUNTER — Other Ambulatory Visit: Payer: Self-pay

## 2019-12-10 DIAGNOSIS — J019 Acute sinusitis, unspecified: Secondary | ICD-10-CM | POA: Diagnosis not present

## 2019-12-10 MED ORDER — AMOXICILLIN 500 MG PO TABS: 500.0000 mg | ORAL_TABLET | Freq: Three times a day (TID) | ORAL | 0 refills | Status: DC

## 2019-12-10 NOTE — Progress Notes (Signed)
   Subjective:    Patient ID: [name], female    DOB: [birth year], 54 y.o.   MRN: [id number]  Sinusitis This is a new problem. Episode onset: 2 weeks. (Facial pain, headache, swelling around eyes, yellow drainage, some blood when blowing nose.) Treatments tried: saline spray, Sudafed. The treatment provided mild relief.  Patient relates a couple weeks of sinus symptoms with allergies sneezing itchy eyes runny nose she states she gets this every spring minutes progressed in sinus pressure pain discomfort denies body aches denies wheezing difficulty breathing denies severe headaches Virtual Visit via Video Note  I connected with Tina Galloway on 12/10/19 at 11:00 AM EDT by a video enabled telemedicine application and verified that I am speaking with the correct person using two identifiers.  Location: Patient: home Provider: office   I discussed the limitations of evaluation and management by telemedicine and the availability of in person appointments. The patient expressed understanding and agreed to proceed.  History of Present Illness:    Observations/Objective:   Assessment and Plan:   Follow Up Instructions:    I discussed the assessment and treatment plan with the patient. The patient was provided an opportunity to ask questions and all were answered. The patient agreed with the plan and demonstrated an understanding of the instructions.   The patient was advised to call back or seek an in-person evaluation if the symptoms worsen or if the condition fails to improve as anticipated.  I provided 20 minutes of non-face-to-face time during this encounter.       Review of Systems     Objective:   Physical Exam  Patient had virtual visit Appears to be in no distress Atraumatic Neuro able to relate and oriented No apparent resp distress Color normal       Assessment & Plan:  Patient was seen today for upper respiratory illness. It is felt  that the patient is dealing with sinusitis.  Antibiotics were prescribed today.  Compliance discussed.  If worsening symptoms or progressive illness notify us.  If emergency call 911 or go to ER.

## 2019-12-10 NOTE — Telephone Encounter (Signed)
Pt gives verbal consent for video call

## 2019-12-17 ENCOUNTER — Ambulatory Visit: Payer: BC Managed Care – PPO

## 2020-01-01 ENCOUNTER — Telehealth (INDEPENDENT_AMBULATORY_CARE_PROVIDER_SITE_OTHER): Payer: BC Managed Care – PPO | Admitting: Nurse Practitioner

## 2020-01-01 ENCOUNTER — Other Ambulatory Visit: Payer: Self-pay

## 2020-01-01 DIAGNOSIS — J019 Acute sinusitis, unspecified: Secondary | ICD-10-CM

## 2020-01-01 DIAGNOSIS — B9689 Other specified bacterial agents as the cause of diseases classified elsewhere: Secondary | ICD-10-CM | POA: Diagnosis not present

## 2020-01-01 MED ORDER — CEFDINIR 300 MG PO CAPS: 300.0000 mg | ORAL_CAPSULE | Freq: Two times a day (BID) | ORAL | 0 refills | Status: DC

## 2020-01-01 NOTE — Progress Notes (Signed)
   Subjective:    Patient ID: Tina Galloway, female    DOB: 1966-05-04, 54 y.o.   MRN: [id number]  HPI Patient presents to the clinic via virtual visit for Sinus complaint.  Sinus symptoms for one month. Finished amoxil and not feeling better. Migraine headache, sinus pressure. No fever. Tried sudafed, zyrtec, and nasal spray.  Virtual Visit via Video Note  I connected with Tina Galloway on 01/01/20 at  3:00 PM EDT by a video enabled telemedicine application and verified that I am speaking with the correct person using two identifiers.  Location: Patient: home Provider: office   I discussed the limitations of evaluation and management by telemedicine and the availability of in person appointments. The patient expressed understanding and agreed to proceed.  History of Present Illness: Patient reports sinus pressure and facial pain and swelling x2 weeks. Patient reports her symptoms improved slightly with amoxicillin, but have gotten increasingly worse since finishing antibiotics. Patient reports she has had nose bleeds, migraine headaches, sore throat, and bilateral ear pain throughout the last week. Producing yellow green mucus over the past few days. No fever. No relief with OTC meds.     Observations/Objective:  Format  Patient present at home Provider present at office Consent for interaction obtained Coronavirus outbreak made virtual visit necessary   Patient non-toxic appearing, speaking in full sentences, and in no noted distress on video visit.   Assessment and Plan: Problem List Items Addressed This Visit    None    Visit Diagnoses    Acute bacterial rhinosinusitis    -  Primary   Relevant Medications   cefdinir (OMNICEF) 300 MG capsule     Meds ordered this encounter  Medications  . cefdinir (OMNICEF) 300 MG capsule    Sig: Take 1 capsule (300 mg total) by mouth 2 (two) times daily.    Dispense:  20 capsule    Refill:  0    Order Specific  Question:   Supervising Provider    Answer:   Lilyan Punt A [[id number]]   Due to persistent symptoms of rhinosinusitis, patient to start new antibiotic and call the office with any problems or worsening of illness. Patient to follow up as needed   Follow Up Instructions: Continue OTC meds as directed.    I discussed the assessment and treatment plan with the patient. The patient was provided an opportunity to ask questions and all were answered. The patient agreed with the plan and demonstrated an understanding of the instructions.   The patient was advised to call back or seek an in-person evaluation if the symptoms worsen or if the condition fails to improve as anticipated.  I provided 15 minutes of non-face-to-face time during this encounter.

## 2020-01-02 ENCOUNTER — Encounter: Payer: Self-pay | Admitting: Nurse Practitioner

## 2020-02-07 ENCOUNTER — Other Ambulatory Visit (HOSPITAL_COMMUNITY): Payer: Self-pay | Admitting: Otolaryngology

## 2020-02-07 ENCOUNTER — Other Ambulatory Visit: Payer: Self-pay | Admitting: Otolaryngology

## 2020-02-07 DIAGNOSIS — J320 Chronic maxillary sinusitis: Secondary | ICD-10-CM

## 2020-02-27 ENCOUNTER — Other Ambulatory Visit (HOSPITAL_COMMUNITY): Payer: Self-pay | Admitting: Adult Health

## 2020-02-27 DIAGNOSIS — Z1231 Encounter for screening mammogram for malignant neoplasm of breast: Secondary | ICD-10-CM

## 2020-02-28 ENCOUNTER — Other Ambulatory Visit: Payer: Self-pay

## 2020-02-28 ENCOUNTER — Ambulatory Visit (HOSPITAL_COMMUNITY)
Admission: RE | Admit: 2020-02-28 | Discharge: 2020-02-28 | Disposition: A | Discharge status: Home / Self Care | Payer: BC Managed Care – PPO | Source: Ambulatory Visit | Attending: Otolaryngology | Admitting: Otolaryngology

## 2020-02-28 DIAGNOSIS — J320 Chronic maxillary sinusitis: Secondary | ICD-10-CM | POA: Insufficient documentation

## 2020-03-05 ENCOUNTER — Other Ambulatory Visit: Payer: Self-pay | Admitting: Otolaryngology

## 2020-03-11 ENCOUNTER — Ambulatory Visit (HOSPITAL_COMMUNITY)
Admission: RE | Admit: 2020-03-11 | Discharge: 2020-03-11 | Disposition: A | Discharge status: Home / Self Care | Payer: BC Managed Care – PPO | Source: Ambulatory Visit | Attending: Adult Health | Admitting: Adult Health

## 2020-03-11 ENCOUNTER — Other Ambulatory Visit: Payer: Self-pay

## 2020-03-11 DIAGNOSIS — Z1231 Encounter for screening mammogram for malignant neoplasm of breast: Secondary | ICD-10-CM | POA: Diagnosis not present

## 2020-03-12 ENCOUNTER — Encounter (HOSPITAL_BASED_OUTPATIENT_CLINIC_OR_DEPARTMENT_OTHER): Payer: Self-pay | Admitting: Otolaryngology

## 2020-03-12 ENCOUNTER — Other Ambulatory Visit: Payer: Self-pay

## 2020-03-17 ENCOUNTER — Other Ambulatory Visit (HOSPITAL_COMMUNITY)
Admission: RE | Admit: 2020-03-17 | Discharge: 2020-03-17 | Disposition: A | Discharge status: Home / Self Care | Payer: BC Managed Care – PPO | Source: Ambulatory Visit | Attending: Otolaryngology | Admitting: Otolaryngology

## 2020-03-17 DIAGNOSIS — Z01812 Encounter for preprocedural laboratory examination: Secondary | ICD-10-CM | POA: Insufficient documentation

## 2020-03-17 DIAGNOSIS — Z20822 Contact with and (suspected) exposure to covid-19: Secondary | ICD-10-CM | POA: Diagnosis not present

## 2020-03-17 LAB — SARS CORONAVIRUS 2 (TAT 6-24 HRS): SARS Coronavirus 2: NEGATIVE

## 2020-03-20 ENCOUNTER — Encounter (HOSPITAL_BASED_OUTPATIENT_CLINIC_OR_DEPARTMENT_OTHER): Payer: Self-pay | Admitting: Otolaryngology

## 2020-03-20 ENCOUNTER — Encounter (HOSPITAL_BASED_OUTPATIENT_CLINIC_OR_DEPARTMENT_OTHER)
Admission: RE | Disposition: A | Discharge status: Home / Self Care | Payer: Self-pay | Source: Home / Self Care | Attending: Otolaryngology

## 2020-03-20 ENCOUNTER — Ambulatory Visit (HOSPITAL_BASED_OUTPATIENT_CLINIC_OR_DEPARTMENT_OTHER): Payer: BC Managed Care – PPO | Admitting: Anesthesiology

## 2020-03-20 ENCOUNTER — Ambulatory Visit (HOSPITAL_BASED_OUTPATIENT_CLINIC_OR_DEPARTMENT_OTHER)
Admission: RE | Admit: 2020-03-20 | Discharge: 2020-03-20 | Disposition: A | Discharge status: Home / Self Care | Payer: BC Managed Care – PPO | Attending: Otolaryngology | Admitting: Otolaryngology

## 2020-03-20 ENCOUNTER — Other Ambulatory Visit: Payer: Self-pay

## 2020-03-20 DIAGNOSIS — J343 Hypertrophy of nasal turbinates: Secondary | ICD-10-CM | POA: Diagnosis not present

## 2020-03-20 DIAGNOSIS — J3489 Other specified disorders of nose and nasal sinuses: Secondary | ICD-10-CM | POA: Insufficient documentation

## 2020-03-20 DIAGNOSIS — J342 Deviated nasal septum: Secondary | ICD-10-CM | POA: Insufficient documentation

## 2020-03-20 DIAGNOSIS — J310 Chronic rhinitis: Secondary | ICD-10-CM | POA: Diagnosis not present

## 2020-03-20 DIAGNOSIS — F17200 Nicotine dependence, unspecified, uncomplicated: Secondary | ICD-10-CM | POA: Insufficient documentation

## 2020-03-20 HISTORY — PX: NASAL SEPTOPLASTY W/ TURBINOPLASTY: SHX2070

## 2020-03-20 HISTORY — PX: ENDOSCOPIC CONCHA BULLOSA RESECTION: SHX6395

## 2020-03-20 SURGERY — SEPTOPLASTY, NOSE, WITH NASAL TURBINATE REDUCTION
Anesthesia: General | Site: Nose | Laterality: Bilateral

## 2020-03-20 MED ORDER — OXYCODONE-ACETAMINOPHEN 5-325 MG PO TABS: 1.0000 | ORAL_TABLET | ORAL | 0 refills | Status: AC | PRN

## 2020-03-20 MED ORDER — SUGAMMADEX SODIUM 200 MG/2ML IV SOLN
INTRAVENOUS | Status: DC | PRN
  Administered 2020-03-20: 200 mg via INTRAVENOUS

## 2020-03-20 MED ORDER — AMOXICILLIN 875 MG PO TABS: 875.0000 mg | ORAL_TABLET | Freq: Two times a day (BID) | ORAL | 0 refills | Status: AC

## 2020-03-20 MED ORDER — PROPOFOL 10 MG/ML IV BOLUS
INTRAVENOUS | Status: AC
  Filled 2020-03-20: qty 20

## 2020-03-20 MED ORDER — LACTATED RINGERS IV SOLN: INTRAVENOUS | Status: DC

## 2020-03-20 MED ORDER — ONDANSETRON HCL 4 MG/2ML IJ SOLN
INTRAMUSCULAR | Status: DC | PRN
  Administered 2020-03-20: 4 mg via INTRAVENOUS

## 2020-03-20 MED ORDER — OXYCODONE HCL 5 MG/5ML PO SOLN: 5.0000 mg | Freq: Once | ORAL | Status: DC | PRN

## 2020-03-20 MED ORDER — PROPOFOL 10 MG/ML IV BOLUS
INTRAVENOUS | Status: DC | PRN
  Administered 2020-03-20: 150 mg via INTRAVENOUS

## 2020-03-20 MED ORDER — SODIUM CHLORIDE 0.9 % IV SOLN
INTRAVENOUS | Status: DC | PRN
  Administered 2020-03-20: 120 ug via INTRAVENOUS

## 2020-03-20 MED ORDER — OXYCODONE HCL 5 MG PO TABS: 5.0000 mg | ORAL_TABLET | Freq: Once | ORAL | Status: DC | PRN

## 2020-03-20 MED ORDER — AMISULPRIDE (ANTIEMETIC) 5 MG/2ML IV SOLN: 10.0000 mg | Freq: Once | INTRAVENOUS | Status: DC | PRN

## 2020-03-20 MED ORDER — ROCURONIUM BROMIDE 10 MG/ML (PF) SYRINGE
PREFILLED_SYRINGE | INTRAVENOUS | Status: AC
  Filled 2020-03-20: qty 10

## 2020-03-20 MED ORDER — ROCURONIUM BROMIDE 100 MG/10ML IV SOLN
INTRAVENOUS | Status: DC | PRN
  Administered 2020-03-20: 70 mg via INTRAVENOUS

## 2020-03-20 MED ORDER — MEPERIDINE HCL 25 MG/ML IJ SOLN: 6.2500 mg | INTRAMUSCULAR | Status: DC | PRN

## 2020-03-20 MED ORDER — LIDOCAINE-EPINEPHRINE 1 %-1:100000 IJ SOLN
INTRAMUSCULAR | Status: DC | PRN
  Administered 2020-03-20: 3 mL

## 2020-03-20 MED ORDER — MIDAZOLAM HCL 2 MG/2ML IJ SOLN
INTRAMUSCULAR | Status: AC
  Filled 2020-03-20: qty 2

## 2020-03-20 MED ORDER — LIDOCAINE 2% (20 MG/ML) 5 ML SYRINGE
INTRAMUSCULAR | Status: AC
  Filled 2020-03-20: qty 5

## 2020-03-20 MED ORDER — FENTANYL CITRATE (PF) 100 MCG/2ML IJ SOLN
INTRAMUSCULAR | Status: AC
  Filled 2020-03-20: qty 2

## 2020-03-20 MED ORDER — MIDAZOLAM HCL 5 MG/5ML IJ SOLN
INTRAMUSCULAR | Status: DC | PRN
  Administered 2020-03-20: 2 mg via INTRAVENOUS

## 2020-03-20 MED ORDER — DEXAMETHASONE SODIUM PHOSPHATE 10 MG/ML IJ SOLN
INTRAMUSCULAR | Status: AC
  Filled 2020-03-20: qty 1

## 2020-03-20 MED ORDER — LIDOCAINE HCL (CARDIAC) PF 100 MG/5ML IV SOSY
PREFILLED_SYRINGE | INTRAVENOUS | Status: DC | PRN
  Administered 2020-03-20: 70 mg via INTRAVENOUS

## 2020-03-20 MED ORDER — CEFAZOLIN SODIUM-DEXTROSE 2-3 GM-%(50ML) IV SOLR
INTRAVENOUS | Status: DC | PRN
  Administered 2020-03-20: 2 g via INTRAVENOUS

## 2020-03-20 MED ORDER — DEXAMETHASONE SODIUM PHOSPHATE 4 MG/ML IJ SOLN
INTRAMUSCULAR | Status: DC | PRN
  Administered 2020-03-20: 10 mg via INTRAVENOUS

## 2020-03-20 MED ORDER — HYDROMORPHONE HCL 1 MG/ML IJ SOLN: 0.2500 mg | INTRAMUSCULAR | Status: DC | PRN

## 2020-03-20 MED ORDER — OXYMETAZOLINE HCL 0.05 % NA SOLN
NASAL | Status: DC | PRN
  Administered 2020-03-20: 1 via TOPICAL

## 2020-03-20 MED ORDER — FENTANYL CITRATE (PF) 100 MCG/2ML IJ SOLN
INTRAMUSCULAR | Status: DC | PRN
  Administered 2020-03-20: 100 ug via INTRAVENOUS

## 2020-03-20 MED ORDER — PROMETHAZINE HCL 25 MG/ML IJ SOLN: 6.2500 mg | INTRAMUSCULAR | Status: DC | PRN

## 2020-03-20 SURGICAL SUPPLY — 48 items
BLADE TRICUT ROTATE M4 4 5PK (BLADE) ×1 IMPLANT
CANISTER SUC SOCK COL 7IN (MISCELLANEOUS) IMPLANT
CANISTER SUCT 1200ML W/VALVE (MISCELLANEOUS) ×2 IMPLANT
COAGULATOR SUCT 8FR VV (MISCELLANEOUS) ×2 IMPLANT
COVER WAND RF STERILE (DRAPES) IMPLANT
DECANTER SPIKE VIAL GLASS SM (MISCELLANEOUS) IMPLANT
DRSG NASOPORE 8CM (GAUZE/BANDAGES/DRESSINGS) ×1 IMPLANT
DRSG TELFA 3X8 NADH (GAUZE/BANDAGES/DRESSINGS) IMPLANT
ELECT REM PT RETURN 9FT ADLT (ELECTROSURGICAL) ×2
ELECTRODE REM PT RTRN 9FT ADLT (ELECTROSURGICAL) ×1 IMPLANT
GLOVE BIO SURGEON STRL SZ7.5 (GLOVE) ×2 IMPLANT
GLOVE BIOGEL PI IND STRL 7.0 (GLOVE) IMPLANT
GLOVE BIOGEL PI INDICATOR 7.0 (GLOVE) ×1
GLOVE ECLIPSE 6.5 STRL STRAW (GLOVE) ×1 IMPLANT
GOWN STRL REUS W/ TWL LRG LVL3 (GOWN DISPOSABLE) ×2 IMPLANT
GOWN STRL REUS W/TWL LRG LVL3 (GOWN DISPOSABLE) ×4
HEMOSTAT SURGICEL 2X14 (HEMOSTASIS) IMPLANT
IV NS 500ML (IV SOLUTION) ×2
IV NS 500ML BAXH (IV SOLUTION) IMPLANT
IV SET EXT 30 76VOL 4 MALE LL (IV SETS) IMPLANT
MANIFOLD NEPTUNE II (INSTRUMENTS) ×1 IMPLANT
NDL HYPO 25X1 1.5 SAFETY (NEEDLE) ×1 IMPLANT
NDL SPNL 25GX3.5 QUINCKE BL (NEEDLE) IMPLANT
NEEDLE HYPO 25X1 1.5 SAFETY (NEEDLE) ×2 IMPLANT
NEEDLE SPNL 25GX3.5 QUINCKE BL (NEEDLE) IMPLANT
NS IRRIG 1000ML POUR BTL (IV SOLUTION) ×2 IMPLANT
PACK BASIN DAY SURGERY FS (CUSTOM PROCEDURE TRAY) ×2 IMPLANT
PACK ENT DAY SURGERY (CUSTOM PROCEDURE TRAY) ×2 IMPLANT
PAD DRESSING TELFA 3X8 NADH (GAUZE/BANDAGES/DRESSINGS) IMPLANT
SLEEVE SCD COMPRESS KNEE MED (MISCELLANEOUS) IMPLANT
SOLUTION BUTLER CLEAR DIP (MISCELLANEOUS) ×3 IMPLANT
SPLINT NASAL AIRWAY SILICONE (MISCELLANEOUS) ×2 IMPLANT
SPONGE GAUZE 2X2 8PLY STRL LF (GAUZE/BANDAGES/DRESSINGS) ×2 IMPLANT
SPONGE NEURO XRAY DETECT 1X3 (DISPOSABLE) ×2 IMPLANT
SUCTION FRAZIER HANDLE 10FR (MISCELLANEOUS)
SUCTION TUBE FRAZIER 10FR DISP (MISCELLANEOUS) IMPLANT
SUT CHROMIC 4 0 P 3 18 (SUTURE) ×2 IMPLANT
SUT ETHILON 3 0 PS 1 (SUTURE) IMPLANT
SUT PLAIN 4 0 ~~LOC~~ 1 (SUTURE) ×2 IMPLANT
SUT PROLENE 3 0 PS 2 (SUTURE) ×2 IMPLANT
SUT VIC AB 4-0 P-3 18XBRD (SUTURE) IMPLANT
SUT VIC AB 4-0 P3 18 (SUTURE)
SYR 50ML LL SCALE MARK (SYRINGE) IMPLANT
TOWEL GREEN STERILE FF (TOWEL DISPOSABLE) ×3 IMPLANT
TUBE CONNECTING 20X1/4 (TUBING) ×1 IMPLANT
TUBE SALEM SUMP 12R W/ARV (TUBING) IMPLANT
TUBE SALEM SUMP 16 FR W/ARV (TUBING) ×2 IMPLANT
YANKAUER SUCT BULB TIP NO VENT (SUCTIONS) ×2 IMPLANT

## 2020-03-20 NOTE — Anesthesia Postprocedure Evaluation (Signed)
Anesthesia Post Note  Patient: Tina Galloway  Procedure(s) Performed: NASAL SEPTOPLASTY WITH TURBINATE REDUCTION (Bilateral Nose) WITH CONCHA BULLOSA RESECTION (Bilateral Nose)     Patient location during evaluation: PACU Anesthesia Type: General Level of consciousness: awake and alert Pain management: pain level controlled Vital Signs Assessment: post-procedure vital signs reviewed and stable Respiratory status: spontaneous breathing, nonlabored ventilation and respiratory function stable Cardiovascular status: blood pressure returned to baseline and stable Postop Assessment: no apparent nausea or vomiting Anesthetic complications: no   No complications documented.  Last Vitals:  Vitals:   03/20/20 1115 03/20/20 1137  BP: 125/79 115/71  Pulse: 62 62  Resp: 16 16  Temp:  37.1 C  SpO2: 96% 99%    Last Pain:  Vitals:   03/20/20 1137  TempSrc: Axillary  PainSc: 3                  Lowella Curb

## 2020-03-20 NOTE — H&P (Signed)
Cc: Chronic nasal obstruction  HPI: The patient is a 54 year old female who returns today for her follow-up evaluation. The patient was last seen 4 weeks ago.  At that time, she was complaining of recurrent sinus infections.  She was experiencing frequent facial pain and nasal obstruction.  She was found to have chronic rhinitis, nasal mucosal congestion and bilateral inferior turbinate hypertrophy.  The patient subsequently underwent a sinus CT scan.  The CT showed no significant acute or chronic sinusitis.  However, she was noted to have severe nasal septal deviation and a large left septal spur, impinging on the left lateral nasal wall.  In addition, she also has significant bilateral inferior turbinate hypertrophy and bilateral large concha bullosa.  The patient returns today complaining of persistent facial pain and nasal obstruction.  She has not responded to Prednisone and daily Flonase nasal spray.  She was also previously treated with multiple allergy medications, including antihistamine and Sudafed.    Exam: The flexible scope was inserted into the right nasal cavity.  Endoscopy of the interior nasal cavity, superior, inferior, and middle meatus was performed. The sphenoid-ethmoid recess was examined. Edematous mucosa was noted.  No polyp, mass, or lesion was appreciated. Severe nasal septal deviation noted.  Olfactory cleft was clear.  Nasopharynx was clear.  Turbinates were hypertrophied but without mass.  Incomplete response to decongestion.  The procedure was repeated on the contralateral side with similar findings. Large septal spur noted. The patient tolerated the procedure well.  Instructions were given to avoid eating or drinking for 2 hours.  Assessment: 1.  Chronic rhinitis with severe nasal septal deviation and bilateral inferior turbinate hypertrophy.  Her large left septal spur is noted to impinge on the left lateral nasal wall.   2.  The patient's CT scan also showed bilateral large  concha bullosa.   3.  More than 95% of her nasal passageways are obstructed bilaterally.   Plan: 1.  The nasal endoscopy findings and the CT images are reviewed with the patient.  2.  The patient should continue with her Flonase nasal spray.   3.  In light of her persistent symptoms, she may benefit from surgical intervention with septoplasty, bilateral endoscopic concha bullosa resection, and bilateral inferior turbinate reduction.  The risks, benefits, alternatives and details of the procedures are reviewed with the patient.  4.  The patient would like to proceed with the procedures.

## 2020-03-20 NOTE — Anesthesia Preprocedure Evaluation (Signed)
Anesthesia Evaluation  Patient identified by MRN, date of birth, ID band Patient awake    Reviewed: Allergy & Precautions, NPO status , Patient's Chart, lab work & pertinent test results  Airway Mallampati: II  TM Distance: >3 FB Neck ROM: Full    Dental no notable dental hx.    Pulmonary neg pulmonary ROS, Current Smoker and Patient abstained from smoking.,    Pulmonary exam normal breath sounds clear to auscultation       Cardiovascular negative cardio ROS Normal cardiovascular exam Rhythm:Regular Rate:Normal     Neuro/Psych negative neurological ROS  negative psych ROS   GI/Hepatic negative GI ROS, Neg liver ROS,   Endo/Other  negative endocrine ROS  Renal/GU negative Renal ROS  negative genitourinary   Musculoskeletal negative musculoskeletal ROS (+)   Abdominal   Peds negative pediatric ROS (+)  Hematology negative hematology ROS (+)   Anesthesia Other Findings   Reproductive/Obstetrics negative OB ROS                             Anesthesia Physical Anesthesia Plan  ASA: II  Anesthesia Plan: General   Post-op Pain Management:    Induction: Intravenous  PONV Risk Score and Plan: 2 and Ondansetron, Midazolam and Treatment may vary due to age or medical condition  Airway Management Planned: Oral ETT  Additional Equipment:   Intra-op Plan:   Post-operative Plan: Extubation in OR  Informed Consent: I have reviewed the patients History and Physical, chart, labs and discussed the procedure including the risks, benefits and alternatives for the proposed anesthesia with the patient or authorized representative who has indicated his/her understanding and acceptance.     Dental advisory given  Plan Discussed with: CRNA  Anesthesia Plan Comments:         Anesthesia Quick Evaluation  

## 2020-03-20 NOTE — Op Note (Signed)
DATE OF PROCEDURE: 03/20/2020  OPERATIVE REPORT   SURGEON: Newman Pies, MD   PREOPERATIVE DIAGNOSES:  1. Severe nasal septal deviation.  2. Bilateral inferior turbinate hypertrophy.  3. Chronic nasal obstruction. 4. Bilateral large concha bullosa.  POSTOPERATIVE DIAGNOSES:  1. Severe nasal septal deviation.  2. Bilateral inferior turbinate hypertrophy.  3. Chronic nasal obstruction. 4. Bilateral large concha bullosa.  PROCEDURE PERFORMED:  1. Septoplasty.  2. Bilateral partial inferior turbinate resection.  3. Bilateral endoscopic concha bullosa resection.  ANESTHESIA: General endotracheal tube anesthesia.   COMPLICATIONS: None.   ESTIMATED BLOOD LOSS: 100 mL.   INDICATION FOR PROCEDURE: Tina Galloway is a 54 y.o. female with a history of chronic nasal obstruction. The patient was treated with antihistamine, decongestant, prednisone, and steroid nasal sprays. However, the patient continued to be symptomatic. On examination, the patient was noted to have bilateral severe inferior turbinate hypertrophy and significant nasal septal deviation, causing significant nasal obstruction. Her CT scan also showed large bilateral concha bullosa.  Based on the above findings, the decision was made for the patient to undergo the above-stated procedures. The risks, benefits, alternatives, and details of the procedures were discussed with the patient. Questions were invited and answered. Informed consent was obtained.   DESCRIPTION OF PROCEDURE: The patient was taken to the operating room and placed supine on the operating table. General endotracheal tube anesthesia was administered by the anesthesiologist. The patient was positioned, and prepped and draped in the standard fashion for nasal surgery. Pledgets soaked with Afrin were placed in both nasal cavities for decongestion. The pledgets were subsequently removed.   Examination of the nasal cavity revealed a severe nasal septal deviation. 1%  lidocaine with 1:100,000 epinephrine was injected onto the nasal septum bilaterally. A hemitransfixion incision was made on the left side. The mucosal flap was carefully elevated on the left side. A cartilaginous incision was made 1 cm superior to the caudal margin of the nasal septum. Mucosal flap was also elevated on the right side in the similar fashion. It should be noted that due to the severe septal deviation, the deviated portion of the cartilaginous and bony septum had to be removed in piecemeal fashion. Once the deviated portions were removed, a straight midline septum was achieved. The septum was then quilted with 4-0 plain gut sutures. The hemitransfixion incision was closed with interrupted 4-0 chromic sutures.   The inferior one half of both hypertrophied inferior turbinate was crossclamped with a Kelly clamp. The inferior one half of each inferior turbinate was then resected with a pair of cross cutting scissors. Hemostasis was achieved with a suction cautery device.   Using a 0 endoscope, the left nasal cavity was examined. A large concha bullosa was noted. Using Tru-Cut forceps, the inferior one third and medial one half of the concha bullosa was resected. The same procedure was repeated on the right side without exception. Doyle splints were applied to the nasal septum.  The care of the patient was turned over to the anesthesiologist. The patient was awakened from anesthesia without difficulty. The patient was extubated and transferred to the recovery room in good condition.   OPERATIVE FINDINGS: Severe nasal septal deviation and bilateral inferior turbinate hypertrophy. Bilateral concha bullosa.  SPECIMEN: None.   FOLLOWUP CARE: The patient be discharged home once she is awake and alert. The patient will be placed on Percocet p.r.n. pain, and amoxicillin 875 mg p.o. b.i.d. for 3 days. The patient will follow up in my office in 3 days  for splint removal.   Tina Foisy Philomena Doheny, MD

## 2020-03-20 NOTE — Discharge Instructions (Addendum)
Post Anesthesia Home Care Instructions  Activity: Get plenty of rest for the remainder of the day. A responsible individual must stay with you for 24 hours following the procedure.  For the next 24 hours, DO NOT: -Drive a car -Operate machinery -Drink alcoholic beverages -Take any medication unless instructed by your physician -Make any legal decisions or sign important papers.  Meals: Start with liquid foods such as gelatin or soup. Progress to regular foods as tolerated. Avoid greasy, spicy, heavy foods. If nausea and/or vomiting occur, drink only clear liquids until the nausea and/or vomiting subsides. Call your physician if vomiting continues.  Special Instructions/Symptoms: Your throat may feel dry or sore from the anesthesia or the breathing tube placed in your throat during surgery. If this causes discomfort, gargle with warm salt water. The discomfort should disappear within 24 hours.  ----------------  POSTOPERATIVE INSTRUCTIONS FOR PATIENTS HAVING NASAL OR SINUS OPERATIONS ACTIVITY: Restrict activity at home for the first two days, resting as much as possible. Light activity is best. You may usually return to work within a week. You should refrain from nose blowing, strenuous activity, or heavy lifting greater than 20lbs for a total of one week after your operation.  If sneezing cannot be avoided, sneeze with your mouth open. DISCOMFORT: You may experience a dull headache and pressure along with nasal congestion and discharge. These symptoms may be worse during the first week after the operation but may last as long as two to four weeks.  Please take Tylenol or the pain medication that has been prescribed for you. Do not take aspirin or aspirin containing medications since they may cause bleeding.  You may experience symptoms of post nasal drainage, nasal congestion, headaches and fatigue for two or three months after your operation.  BLEEDING: You may have some blood tinged nasal  drainage for approximately two weeks after the operation.  The discharge will be worse for the first week.  Please call our office at [phone] or go to the nearest hospital emergency room if you experience any of the following: heavy, bright red blood from your nose or mouth that lasts longer than 15 minutes or coughing up or vomiting bright red blood or blood clots. GENERAL CONSIDERATIONS: 1. A gauze dressing will be placed on your upper lip to absorb any drainage after the operation. You may need to change this several times a day.  If you do not have very much drainage, you may remove the dressing.  Remember that you may gently wipe your nose with a tissue and sniff in, but DO NOT blow your nose. 2. Please keep all of your postoperative appointments.  Your final results after the operation will depend on proper follow-up.  The initial visit is usually 2 to 5 days after the operation.  During this visit, the remaining nasal packing and internal septal splints will be removed.  Your nasal and sinus cavities will be cleaned.  During the second visit, your nasal and sinus cavities will be cleaned again. Have someone drive you to your first two postoperative appointments.  3. How you care for your nose after the operation will influence the results that you obtain.  You should follow all directions, take your medication as prescribed, and call our office [phone] with any problems or questions. 4. You may be more comfortable sleeping with your head elevated on two pillows. 5. Do not take any medications that we have not prescribed or recommended. WARNING SIGNS: if any of the following should   occur, please call our office: 1. Persistent fever greater than 102F. 2. Persistent vomiting. 3. Severe and constant pain that is not relieved by prescribed pain medication. 4. Trauma to the nose. 5. Rash or unusual side effects from any medicines.      

## 2020-03-20 NOTE — Anesthesia Procedure Notes (Signed)
Procedure Name: Intubation Performed by: Giliana Vantil M, CRNA Pre-anesthesia Checklist: Patient identified, Emergency Drugs available, Suction available and Patient being monitored Patient Re-evaluated:Patient Re-evaluated prior to induction Oxygen Delivery Method: Circle system utilized Preoxygenation: Pre-oxygenation with 100% oxygen Induction Type: IV induction Ventilation: Mask ventilation without difficulty Laryngoscope Size: Mac and 3 Grade View: Grade I Tube type: Oral Number of attempts: 1 Airway Equipment and Method: Stylet and Oral airway Placement Confirmation: ETT inserted through vocal cords under direct vision,  positive ETCO2,  breath sounds checked- equal and bilateral and CO2 detector Secured at: 22 cm Tube secured with: Tape Dental Injury: Teeth and Oropharynx as per pre-operative assessment        

## 2020-03-20 NOTE — Transfer of Care (Signed)
Immediate Anesthesia Transfer of Care Note  Patient: Tina Galloway Sides  Procedure(s) Performed: NASAL SEPTOPLASTY WITH TURBINATE REDUCTION (Bilateral Nose) WITH CONCHA BULLOSA RESECTION (Bilateral Nose)  Patient Location: PACU  Anesthesia Type:General  Level of Consciousness: awake, alert  and oriented  Airway & Oxygen Therapy: Patient Spontanous Breathing and Patient connected to face mask oxygen  Post-op Assessment: Report given to RN and Post -op Vital signs reviewed and stable  Post vital signs: Reviewed and stable  Last Vitals:  Vitals Value Taken Time  BP    Temp    Pulse    Resp    SpO2      Last Pain:  Vitals:   03/20/20 0744  TempSrc: Oral  PainSc: 0-No pain         Complications: No complications documented.

## 2020-03-23 ENCOUNTER — Encounter (HOSPITAL_BASED_OUTPATIENT_CLINIC_OR_DEPARTMENT_OTHER): Payer: Self-pay | Admitting: Otolaryngology

## 2020-04-16 ENCOUNTER — Ambulatory Visit (INDEPENDENT_AMBULATORY_CARE_PROVIDER_SITE_OTHER): Payer: BC Managed Care – PPO | Admitting: Family Medicine

## 2020-04-16 ENCOUNTER — Other Ambulatory Visit: Payer: Self-pay

## 2020-04-16 VITALS — BP 122/82 | Temp 98.2°F | Wt 156.6 lb

## 2020-04-16 DIAGNOSIS — G542 Cervical root disorders, not elsewhere classified: Secondary | ICD-10-CM

## 2020-04-16 DIAGNOSIS — R519 Headache, unspecified: Secondary | ICD-10-CM | POA: Diagnosis not present

## 2020-04-16 NOTE — Progress Notes (Signed)
   Subjective:    Patient ID: Tina Galloway, female    DOB: 1966-08-12, 54 y.o.   MRN: [id number]  HPI  Patient arrives with numbness and pain on right side of face for 6 weeks. Patient states she went to ENT and had septal surgery 03/20/20 and has been cleared but still having the same issue. Patient presents today she is having discomfort along the right side of her neck as well as into the jawline area.  It also radiates into the trapezius and down the arm.  She also has numbness and tingling down into the hand.  She relates it is very painful to raise her arm up above shoulder level.  It hurts to rotate her neck to the right.  She denies vomiting but she has been having daily headaches over the past couple months she does use ibuprofen as well as Excedrin Migraine She states that the discomfort is bad enough to wake her up in the middle of the night sometimes at 2 AM or 3 AM.  She also states that it seems to get better with the medication but then the pain comes back.  She denies any injury.  Does not have history of hypertension. Review of Systems Please see above.  Denies any type of chest tightness pressure pain she does relate some nausea but no vomiting.  No abdominal pain.    Objective:   Physical Exam Subjective discomfort on the right side of her neck.  Patient has increased pain with rotation to the right and tilting to the right.  Tenderness in the trapezius.  Temporal regions nontender.  Pupils responsive to light.  Strength in the hands biceps shoulders are good.  Reflexes are good bilateral.       Assessment & Plan:  Nocturnal headaches It is possible that some of her headaches is overuse of the Excedrin Migraine I have encouraged her to try to cut back on this But because she is having nocturnal headaches and these are new onset headaches over the past 3 months MRI of the brain is indicated  Cervical nerve impingement-this been going on for 6 to 8 weeks progressive  pain and discomfort that keeps her from going to sleep at night makes it very difficult to feel good during the day despite using medication.  I would recommend an MRI of the cervical spine she has previous cervical spine disease from an MRI back in 2010 which was treated with injections rather than surgery  Patient may well need referral to neurosurgery for her neck but we need to see the MRI first  It is possible that we may utilize Topamax but wait on the MRI first

## 2020-04-21 ENCOUNTER — Telehealth: Payer: Self-pay | Admitting: Family Medicine

## 2020-04-21 NOTE — Telephone Encounter (Signed)
This order was signed off on thank you

## 2020-04-21 NOTE — Telephone Encounter (Signed)
Please sign order for MRI Brain without & MRI C-spine without  Will fax signed order to [facility] (a benefit through pt's insurance) - they will schedule & contact pt   Forward to [place] to be faxed & documented      Order in red folder in basket on wall

## 2020-04-22 NOTE — Telephone Encounter (Signed)
Please advise. Thank you

## 2020-04-24 NOTE — Telephone Encounter (Signed)
Order faxed.

## 2020-05-06 ENCOUNTER — Telehealth: Payer: Self-pay | Admitting: *Deleted

## 2020-05-06 NOTE — Telephone Encounter (Signed)
Patient calling requesting results of MRI that was done last week. Have you seen anything come through from [facility]?

## 2020-05-06 NOTE — Telephone Encounter (Signed)
PLZ GET THE REPORTS SENT TO [place] I SAW THE MRI REPORT FOR THE BRAIN WHICH WAS NEGATIVE BUT DID NOT SEE THE C SPINE (I don't see the report in the chart so therefore have facility fax both reports to [place]) Plz let pt know MRI brain normal but we are calling for c spine report (nurses when that report comes in if possible have Dr Ladona Ridgel look at it or send me the readout via messages (I will be back Monday) May need to refer if any worrisome issues Otherwise I will address on Monday Thanks SAL

## 2020-05-07 ENCOUNTER — Other Ambulatory Visit: Payer: Self-pay | Admitting: *Deleted

## 2020-05-07 ENCOUNTER — Telehealth: Payer: Self-pay | Admitting: Family Medicine

## 2020-05-07 DIAGNOSIS — R519 Headache, unspecified: Secondary | ICD-10-CM

## 2020-05-07 DIAGNOSIS — G542 Cervical root disorders, not elsewhere classified: Secondary | ICD-10-CM

## 2020-05-07 DIAGNOSIS — M5030 Other cervical disc degeneration, unspecified cervical region: Secondary | ICD-10-CM

## 2020-05-07 NOTE — Telephone Encounter (Signed)
Nurses Please let her know I did review over her MRI The MRI of the brain looks good.  There is no findings of anything that is worrisome. MRI of the cervical spine shows anterior listhesis and also some degenerative changes along with arthropathy.  She also has degenerative disc disease but no herniated disc.  There is significant spurring of the bones in the neck that impinges upon the nerves in the neck.  This is mainly degenerative and arthritic changes that are causing significant narrowing of the foramina (which is where the nerves passed through) thus resulting with pinching of the nerves.  At this point I would recommend that we refer her to neurosurgery for further evaluation.  I am not certain that they would recommend surgery for potential injections.  It depends on their expert evaluation.  Her options would include neurosurgery group in [place] which has several excellent neurosurgeons.  Certainly other options would be [facility] or [facility] if the patient chooses differently.

## 2020-05-07 NOTE — Telephone Encounter (Signed)
Patient notified of results and verbalized understanding. Referral in epic for [place] neurosurgery.

## 2020-07-23 ENCOUNTER — Telehealth: Payer: Self-pay | Admitting: *Deleted

## 2020-07-23 ENCOUNTER — Ambulatory Visit: Payer: BC Managed Care – PPO | Admitting: Neurology

## 2020-07-23 NOTE — Telephone Encounter (Signed)
Pt canceled 12/9 appointment less than 24 hours prior. Has rescheduled to 10/01/20.

## 2020-09-21 ENCOUNTER — Encounter: Payer: Self-pay | Admitting: Family Medicine

## 2020-09-21 ENCOUNTER — Other Ambulatory Visit: Payer: Self-pay

## 2020-09-21 ENCOUNTER — Ambulatory Visit (INDEPENDENT_AMBULATORY_CARE_PROVIDER_SITE_OTHER): Payer: BC Managed Care – PPO | Admitting: Family Medicine

## 2020-09-21 VITALS — BP 120/78 | HR 87 | Temp 97.8°F | Ht 69.0 in | Wt 160.0 lb

## 2020-09-21 DIAGNOSIS — R519 Headache, unspecified: Secondary | ICD-10-CM

## 2020-09-21 DIAGNOSIS — H9312 Tinnitus, left ear: Secondary | ICD-10-CM

## 2020-09-21 NOTE — Progress Notes (Signed)
   Subjective:    Patient ID: [name], female    DOB: 07-28-1966, 55 y.o.   MRN: [id number]  HPIRinging in left ear for several months. Got worse after having surgery for deviated septum.   Patient relates tinnitus over the past 4 months at times had some ringing in the ears other times it sounds loud noise but it is constant.  Not pulsatile.  Makes it difficult for her to go to sleep at night but denies any vomiting from it.  She feels her hearing is doing okay.  Also relates frequent headaches these occur multiple different times throughout the day and the week of happens at least 3 times per week describes it as a aching throbbing she takes an Excedrin Migraine for it she states that does help occasionally does wake her up at night  MRI had one in September which did not show any tumor or growth  Review of Systems Please see above    Objective:   Physical Exam Eardrums are normal neck no masses lungs are clear no crackles heart regular HEENT benign       Assessment & Plan:  1. Tinnitus of left ear More than likely this is standard tinnitus related to sensorineural loss.  But because it is unilateral is been only going on for about 4 months may need further evaluation to rule out a acoustic nerve fibroma  She has an appointment coming up with Dr.Teoh.  She was told to try to get a full audiologic evaluation to rule out sensorineural loss.  Also to discuss with Dr.Teoh if further evaluation for an acoustic neuroma would be indicated.  2. Frequent headaches She is due a headache calendar.  I have encouraged her to try to taper off of the Excedrin migraines because these can cause rebound headaches  She has an appointment coming up with Dr.Yan due to some intermittent numbness on the right side of her face.  I have encouraged her to discuss with Dr.Yan about potentially trying a preventative headache medication such as topiramate she will discuss this further with  them.  She is to keep a headache calendar and show this to Dr.Yan She is to follow-up here if any ongoing troubles or if she needs our assistance  She will send [place] a MyChart message in several weeks letting us know how things are going.  She relates she gets her female health checkups through her gynecologist including lab work and she keeps up on mammograms

## 2020-09-29 ENCOUNTER — Encounter: Payer: Self-pay | Admitting: *Deleted

## 2020-10-01 ENCOUNTER — Other Ambulatory Visit: Payer: Self-pay

## 2020-10-01 ENCOUNTER — Ambulatory Visit (INDEPENDENT_AMBULATORY_CARE_PROVIDER_SITE_OTHER): Payer: BC Managed Care – PPO | Admitting: Neurology

## 2020-10-01 ENCOUNTER — Encounter: Payer: Self-pay | Admitting: Neurology

## 2020-10-01 VITALS — BP 116/76 | HR 63 | Ht 69.0 in | Wt 163.0 lb

## 2020-10-01 DIAGNOSIS — R200 Anesthesia of skin: Secondary | ICD-10-CM

## 2020-10-01 DIAGNOSIS — G43709 Chronic migraine without aura, not intractable, without status migrainosus: Secondary | ICD-10-CM

## 2020-10-01 MED ORDER — SUMATRIPTAN SUCCINATE 50 MG PO TABS: 50.0000 mg | ORAL_TABLET | ORAL | 6 refills | Status: DC | PRN

## 2020-10-01 MED ORDER — NORTRIPTYLINE HCL 10 MG PO CAPS: 20.0000 mg | ORAL_CAPSULE | Freq: Every day | ORAL | 11 refills | Status: DC

## 2020-10-01 NOTE — Progress Notes (Signed)
Chief Complaint  Patient presents with  . Atypical Trigeminal Neuralgia    New Pt "right side of face numbness x 6 months, not dental related"    HISTORICAL  Tina Galloway is a 55 year old female, seen in request by her primary care physician Dr. Gerda Diss, [place] Picket for evaluation of right facial numbness, initial evaluation was on October 01, 2020  I reviewed and summarized the referring note.  She had nasal septal deviation surgery in August 2021, we did help her stuffed nose, about a month later, she began to noticed numbness involving for right face, involving right forehead, cheek, jaw area, no pain, felt decreased to light touch, in addition she began to experience frequent medium pitch left tinnitus, which has been persistent  She did have a history of migraine headache, increased frequency over the past 6 months, moderate to severe bifrontal headaches with light noise sensitivity, nauseous, last for 3 hours, sometimes nausea, it happened 2-3 times each day, sometimes daily basis  Personally reviewed MRI of cervical spine, from [facility] in September 2021  1. Degenerative disc disease most significant at C5-6 with grade 1 anterolisthesis and disc osteophyte causing mild canal stenosis with severe left neural foraminal narrowing.   2. There is severe neural foraminal narrowing on the right at C4-5 and moderate bilaterally at C6-7.   MRI of brain was normal   REVIEW OF SYSTEMS: Full 14 system review of systems performed and notable only for as above All other review of systems were negative.  ALLERGIES: No Known Allergies  HOME MEDICATIONS: Current Outpatient Medications  Medication Sig Dispense Refill  . aspirin-acetaminophen-caffeine (EXCEDRIN MIGRAINE) 250-250-65 MG tablet as needed.    Marland Kitchen BIOTIN FORTE PO Take by mouth. Dose unknown    . MAGNESIUM CARBONATE PO Take by mouth. Dose unknown    . UNKNOWN TO PATIENT Fiber tab     No current facility-administered  medications for this visit.    PAST MEDICAL HISTORY: Past Medical History:  Diagnosis Date  . Abnormal Pap smear   . Cervical radiculopathy   . Cervical spondylosis   . Hormone replacement therapy (HRT) 12/14/2015  . Menopausal symptoms 06/04/2013  . Trigeminal neuralgia   . Vaginal Pap smear, abnormal     PAST SURGICAL HISTORY: Past Surgical History:  Procedure Laterality Date  . COLONOSCOPY N/A 04/12/2019   Procedure: COLONOSCOPY;  Surgeon: West Bali, MD;  [facility];  Service: Endoscopy;  Laterality: N/A;  9:30  . ENDOSCOPIC CONCHA BULLOSA RESECTION Bilateral 03/20/2020   Procedure: WITH CONCHA BULLOSA RESECTION;  Surgeon: Newman Pies, MD;  Location: [facility];  Service: ENT;  Laterality: Bilateral;  . NASAL SEPTOPLASTY W/ TURBINOPLASTY Bilateral 03/20/2020   Procedure: NASAL SEPTOPLASTY WITH TURBINATE REDUCTION;  Surgeon: Newman Pies, MD;  Location: [facility];  Service: ENT;  Laterality: Bilateral;  . POLYPECTOMY  04/12/2019   Procedure: POLYPECTOMY;  Surgeon: West Bali, MD;  Location: [facility];  Service: Endoscopy;;  . TONSILLECTOMY AND ADENOIDECTOMY      FAMILY HISTORY: Family History  Problem Relation Age of Onset  . Cancer Mother 77       breast     SOCIAL HISTORY: Social History   Socioeconomic History  . Marital status: Married    Spouse name: Not on file  . Number of children: 2  . Years of education: Not on file  . Highest education level: Some college, no degree  Occupational History  . Not on  file  Tobacco Use  . Smoking status: Light Tobacco Smoker    Years: 10.00    Types: Cigarettes  . Smokeless tobacco: Never Used  . Tobacco comment: socially smokes-on weekends  Vaping Use  . Vaping Use: Never used  Substance and Sexual Activity  . Alcohol use: Yes    Comment: socially  . Drug use: No  . Sexual activity: Yes    Birth control/protection: Post-menopausal  Other Topics Concern  . Not on file   Social History Narrative   Lives with husband   Social Determinants of Health   Financial Resource Strain: Not on file  Food Insecurity: Not on file  Transportation Needs: Not on file  Physical Activity: Not on file  Stress: Not on file  Social Connections: Not on file  Intimate Partner Violence: Not on file     PHYSICAL EXAM   Vitals:   10/01/20 0753  BP: 116/76  Pulse: 63  Weight: 163 lb (73.9 kg)  Height: 5\' 9"  (1.753 m)   Not recorded     Body mass index is 24.07 kg/m.  PHYSICAL EXAMNIATION:  Gen: NAD, conversant, well nourised, well groomed                     Cardiovascular: Regular rate rhythm, no peripheral edema, warm, nontender. Eyes: Conjunctivae clear without exudates or hemorrhage Neck: Supple, no carotid bruits. Pulmonary: Clear to auscultation bilaterally   NEUROLOGICAL EXAM:  MENTAL STATUS: Speech:    Speech is normal; fluent and spontaneous with normal comprehension.  Cognition:     Orientation to time, place and person     Normal recent and remote memory     Normal Attention span and concentration     Normal Language, naming, repeating,spontaneous speech     Fund of knowledge   CRANIAL NERVES: CN II: Visual fields are full to confrontation. Pupils are round equal and briskly reactive to light. CN III, IV, VI: extraocular movement are normal. No ptosis. CN V: Reported hypersensitivity, split tuning fork at the right trigeminal territory, corneal reflexes were normal and symmetric. CN VII: Face is symmetric with normal eye closure  CN VIII: Hearing is normal to causal conversation. CN IX, X: Phonation is normal. CN XI: Head turning and shoulder shrug are intact  MOTOR: There is no pronator drift of out-stretched arms. Muscle bulk and tone are normal. Muscle strength is normal.  REFLEXES: Reflexes are 2+ and symmetric at the biceps, triceps, knees, and ankles. Plantar responses are flexor.  SENSORY: Reported decreased vibratory  sensation at right V1, V2, V3 territory, split midline with tuning fork, also reported sensitivity to light touch at the right V1, V2, V3 territory,  COORDINATION: There is no trunk or limb dysmetria noted.  GAIT/STANCE: Posture is normal. Gait is steady with normal steps, base, arm swing, and turning. Heel and toe walking are normal. Tandem gait is normal.  Romberg is absent.   DIAGNOSTIC DATA (LABS, IMAGING, TESTING) - I reviewed patient records, labs, notes, testing and imaging myself where available.   ASSESSMENT AND PLAN  Alayzha An is a 55 y.o. female   Chronic migraine headaches Right facial paresthesia  Normal neurological examinations, with exception of sensitivity at right V1, V2, V3 territory, but also reported abnormal sensations split the midline with turning fork.  Start nortriptyline 10, titrating to 20 mg every night as preventive medications  Stop daily Excedrin Migraine use, Imitrex 50 mg as needed for moderate to severe headaches  Levert Feinstein, M.D. Ph.D.  [facility] [address] [place], [place] [address] Ph: [phone] Fax: [phone]  CC:  Babs Sciara, MD [address] [place],  [place] [address]

## 2020-10-02 ENCOUNTER — Encounter: Payer: Self-pay | Admitting: Neurology

## 2020-10-23 ENCOUNTER — Other Ambulatory Visit: Payer: Self-pay | Admitting: Neurology

## 2020-12-04 ENCOUNTER — Other Ambulatory Visit: Payer: BC Managed Care – PPO | Admitting: Adult Health

## 2020-12-22 ENCOUNTER — Other Ambulatory Visit: Payer: BC Managed Care – PPO | Admitting: Adult Health

## 2020-12-31 ENCOUNTER — Ambulatory Visit: Payer: BC Managed Care – PPO | Admitting: Neurology

## 2021-01-26 ENCOUNTER — Other Ambulatory Visit (HOSPITAL_COMMUNITY): Payer: Self-pay | Admitting: Adult Health

## 2021-01-26 DIAGNOSIS — Z1231 Encounter for screening mammogram for malignant neoplasm of breast: Secondary | ICD-10-CM

## 2021-02-17 ENCOUNTER — Ambulatory Visit: Payer: BC Managed Care – PPO | Admitting: Neurology

## 2021-02-23 ENCOUNTER — Ambulatory Visit (INDEPENDENT_AMBULATORY_CARE_PROVIDER_SITE_OTHER): Payer: BC Managed Care – PPO | Admitting: Adult Health

## 2021-02-23 ENCOUNTER — Other Ambulatory Visit: Payer: Self-pay

## 2021-02-23 ENCOUNTER — Other Ambulatory Visit (HOSPITAL_COMMUNITY)
Admission: RE | Admit: 2021-02-23 | Discharge: 2021-02-23 | Disposition: A | Discharge status: Home / Self Care | Payer: BC Managed Care – PPO | Source: Ambulatory Visit | Attending: Adult Health | Admitting: Adult Health

## 2021-02-23 ENCOUNTER — Encounter: Payer: Self-pay | Admitting: Adult Health

## 2021-02-23 VITALS — BP 112/75 | HR 69 | Ht 69.0 in | Wt 154.0 lb

## 2021-02-23 DIAGNOSIS — Z01419 Encounter for gynecological examination (general) (routine) without abnormal findings: Secondary | ICD-10-CM | POA: Insufficient documentation

## 2021-02-23 DIAGNOSIS — Z780 Asymptomatic menopausal state: Secondary | ICD-10-CM | POA: Diagnosis not present

## 2021-02-23 DIAGNOSIS — Z1211 Encounter for screening for malignant neoplasm of colon: Secondary | ICD-10-CM | POA: Diagnosis not present

## 2021-02-23 LAB — HEMOCCULT GUIAC POC 1CARD (OFFICE): Fecal Occult Blood, POC: NEGATIVE

## 2021-02-23 NOTE — Progress Notes (Signed)
Patient ID: Tina Galloway, female   DOB: 09-Sep-1965, 55 y.o.   MRN: [id number] History of Present Illness: Tina Galloway is a 55 year old white female,married, PM in for a well woman gyn exam and pap. She requests fasting labs.  PCP is Dr Tina Galloway.   Current Medications, Allergies, Past Medical History, Past Surgical History, Family History and Social History were reviewed in [employer] record.     Review of Systems: Patient denies any headaches, hearing loss, fatigue, blurred vision, shortness of breath, chest pain, abdominal pain, problems with bowel movements, urination, or intercourse. No joint pain or mood swings. Denies any vaginal bleeding She has facial pain at times, grinds her teeth, wears mouth guard, and has seen neurologist and had MRI    Physical Exam:BP 112/75 (BP Location: Left Arm, Patient Position: Sitting, Cuff Size: Normal)   Pulse 69   Ht 5\' 9"  (1.753 m)   Wt 154 lb (69.9 kg)   LMP  (LMP Unknown)   BMI 22.74 kg/m   General:  Well developed, well nourished, no acute distress Skin:  Warm and dry Neck:  Midline trachea, normal thyroid, good ROM, no lymphadenopathy Lungs; Clear to auscultation bilaterally Breast:  No dominant palpable mass, retraction, or nipple discharge Cardiovascular: Regular rate and rhythm Abdomen:  Soft, non tender, no hepatosplenomegaly Pelvic:  External genitalia is normal in appearance, no lesions.  The vagina is normal in appearance. Urethra has no lesions or masses. The cervix is bulbous. Pap with HR HPV genotyping performed. Uterus is felt to be normal size, shape, and contour.  No adnexal masses or tenderness noted.Bladder is non tender, no masses felt. Rectal: Good sphincter tone, no polyps, or hemorrhoids felt.  Hemoccult negative. Extremities/musculoskeletal:  No swelling or varicosities noted, no clubbing or cyanosis Psych:  No mood changes, alert and cooperative,seems happy AA is 8 Fall risk is  low Depression screen [facility] 2/9 02/23/2021 09/21/2020 09/03/2018  Decreased Interest 0 0 0  Down, Depressed, Hopeless 0 0 0  PHQ - 2 Score 0 0 0  Altered sleeping 1 - -  Tired, decreased energy 1 - -  Change in appetite 1 - -  Feeling bad or failure about yourself  1 - -  Trouble concentrating 0 - -  Moving slowly or fidgety/restless 0 - -  Suicidal thoughts 0 - -  PHQ-9 Score 4 - -    GAD 7 : Generalized Anxiety Score 02/23/2021  Nervous, Anxious, on Edge 0  Control/stop worrying 1  Worry too much - different things 1  Trouble relaxing 0  Restless 0  Easily annoyed or irritable 1  Afraid - awful might happen 1  Total GAD 7 Score 4      Upstream - 02/23/21 0902       Pregnancy Intention Screening   Does the patient want to become pregnant in the next year? No    Does the patient's partner want to become pregnant in the next year? No    Would the patient like to discuss contraceptive options today? No      Contraception Wrap Up   Current Method No Method - Other Reason   PM   End Method No Method - Other Reason   PM   Contraception Counseling Provided No            Pt gave verbal consent for exam without chaperone.  Impression and Plan: 1. Encounter for gynecological examination with Papanicolaou smear of cervix Pap sent Physical  in 1 year Pap in 3 if normal Mammogram in August and yearly Colonoscopy per GI, had 05/2020  - Cytology - PAP( [place]) - CBC - Comprehensive metabolic panel - TSH - Lipid panel  2. Encounter for screening fecal occult blood testing - POCT occult blood stool  3. Postmenopause

## 2021-02-24 LAB — COMPREHENSIVE METABOLIC PANEL
ALT: 25 IU/L (ref 0–32)
AST: 33 IU/L (ref 0–40)
Albumin/Globulin Ratio: 2.5 — ABNORMAL HIGH (ref 1.2–2.2)
Albumin: 5.2 g/dL — ABNORMAL HIGH (ref 3.8–4.9)
Alkaline Phosphatase: 87 IU/L (ref 44–121)
BUN/Creatinine Ratio: 14 (ref 9–23)
BUN: 10 mg/dL (ref 6–24)
Bilirubin Total: 0.5 mg/dL (ref 0.0–1.2)
CO2: 21 mmol/L (ref 20–29)
Calcium: 9.8 mg/dL (ref 8.7–10.2)
Chloride: 102 mmol/L (ref 96–106)
Creatinine, Ser: 0.73 mg/dL (ref 0.57–1.00)
Globulin, Total: 2.1 g/dL (ref 1.5–4.5)
Glucose: 85 mg/dL (ref 65–99)
Potassium: 4.3 mmol/L (ref 3.5–5.2)
Sodium: 141 mmol/L (ref 134–144)
Total Protein: 7.3 g/dL (ref 6.0–8.5)
eGFR: 99 mL/min/{1.73_m2} (ref >59)

## 2021-02-24 LAB — CBC
Hematocrit: 38.1 % (ref 34.0–46.6)
Hemoglobin: 12.7 g/dL (ref 11.1–15.9)
MCH: 32.2 pg (ref 26.6–33.0)
MCHC: 33.3 g/dL (ref 31.5–35.7)
MCV: 97 fL (ref 79–97)
Platelets: 231 10*3/uL (ref 150–450)
RBC: 3.94 x10E6/uL (ref 3.77–5.28)
RDW: 11.8 % (ref 11.7–15.4)
WBC: 5.4 10*3/uL (ref 3.4–10.8)

## 2021-02-24 LAB — LIPID PANEL
Chol/HDL Ratio: 2 ratio (ref 0.0–4.4)
Cholesterol, Total: 245 mg/dL — ABNORMAL HIGH (ref 100–199)
HDL: 125 mg/dL (ref >39)
LDL Chol Calc (NIH): 111 mg/dL — ABNORMAL HIGH (ref 0–99)
Triglycerides: 54 mg/dL (ref 0–149)
VLDL Cholesterol Cal: 9 mg/dL (ref 5–40)

## 2021-02-24 LAB — TSH: TSH: 1.06 u[IU]/mL (ref 0.450–4.500)

## 2021-03-01 LAB — CYTOLOGY - PAP
Comment: NEGATIVE
Diagnosis: NEGATIVE
High risk HPV: NEGATIVE

## 2021-03-17 ENCOUNTER — Other Ambulatory Visit: Payer: Self-pay

## 2021-03-17 ENCOUNTER — Ambulatory Visit (HOSPITAL_COMMUNITY)
Admission: RE | Admit: 2021-03-17 | Discharge: 2021-03-17 | Disposition: A | Discharge status: Home / Self Care | Payer: BC Managed Care – PPO | Source: Ambulatory Visit | Attending: Adult Health | Admitting: Adult Health

## 2021-03-17 DIAGNOSIS — Z1231 Encounter for screening mammogram for malignant neoplasm of breast: Secondary | ICD-10-CM | POA: Diagnosis present

## 2021-08-06 IMAGING — MG MM DIGITAL SCREENING BILAT W/ TOMO AND CAD
8 series · 9 of 24 positions shown · non-contrast
Comparison: Previous exam(s).

CLINICAL DATA: Screening.

EXAM:
DIGITAL SCREENING BILATERAL MAMMOGRAM WITH TOMOSYNTHESIS AND CAD
TECHNIQUE: Bilateral screening digital craniocaudal and mediolateral oblique
mammograms were obtained. Bilateral screening digital breast
tomosynthesis was performed. The images were evaluated with
computer-aided detection.

[R MLO synth-2D]
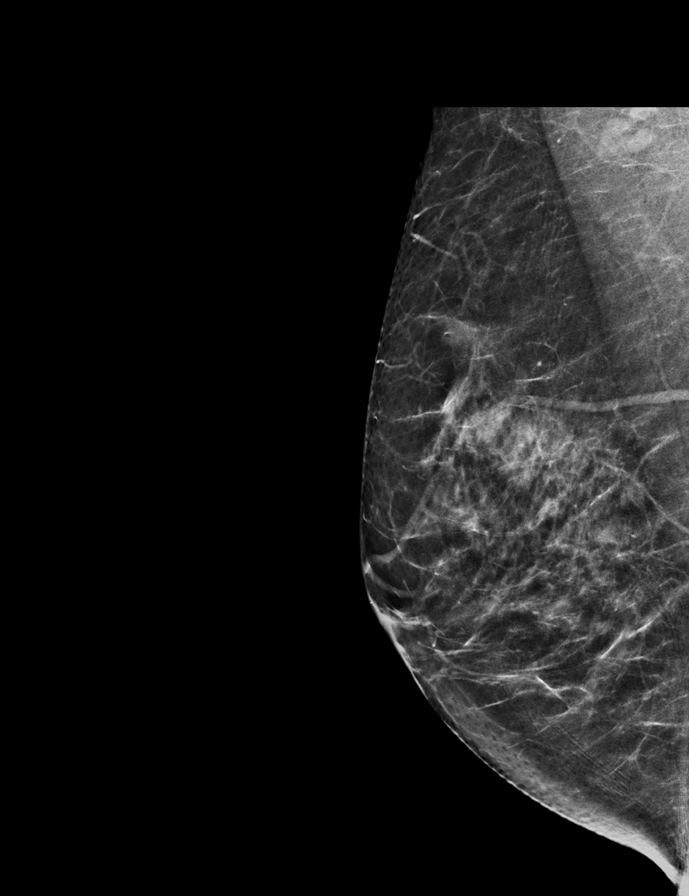

[L MLO synth-2D]
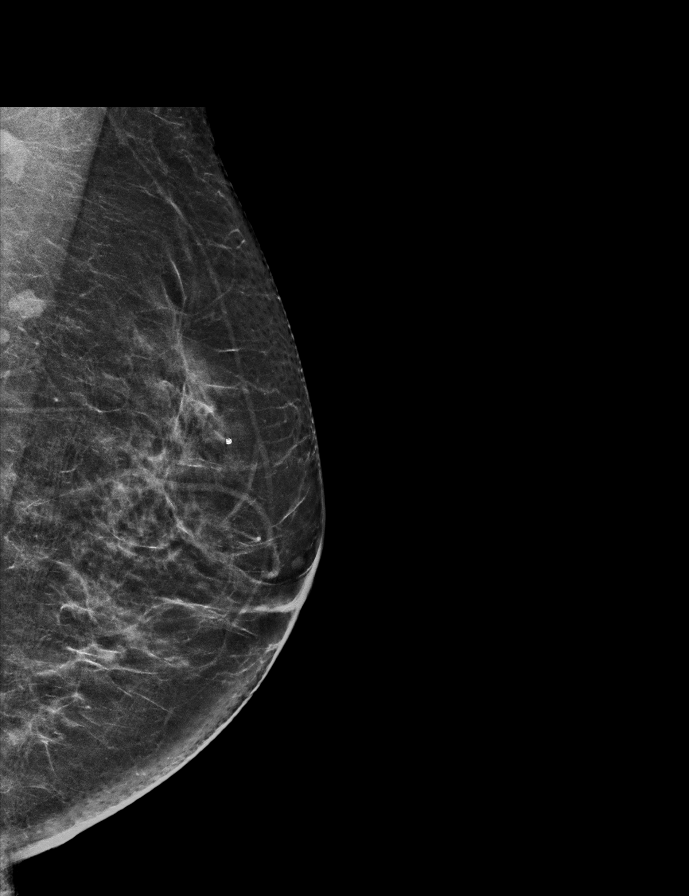

[R CC synth-2D]
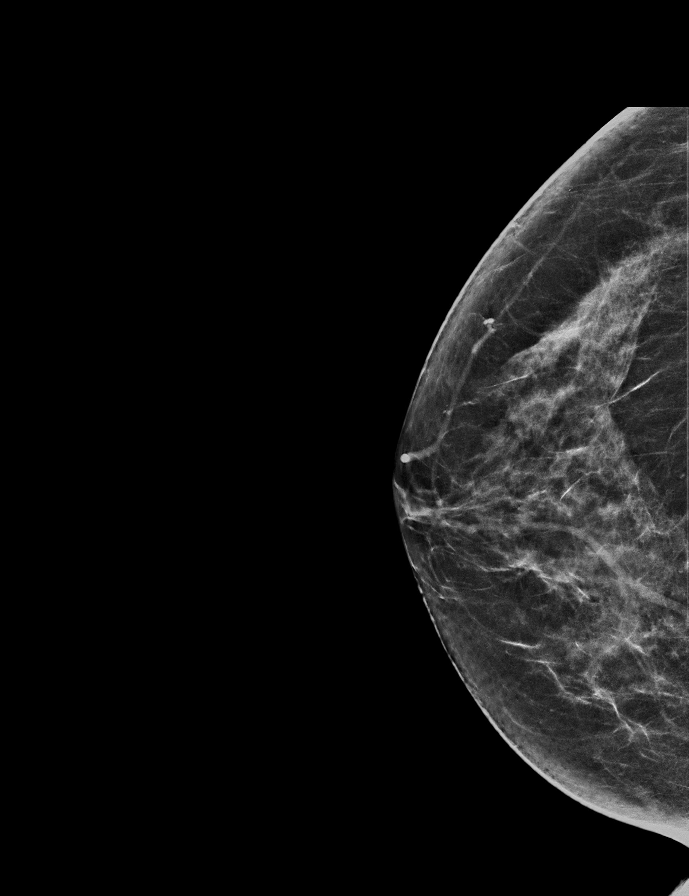

[L CC synth-2D]
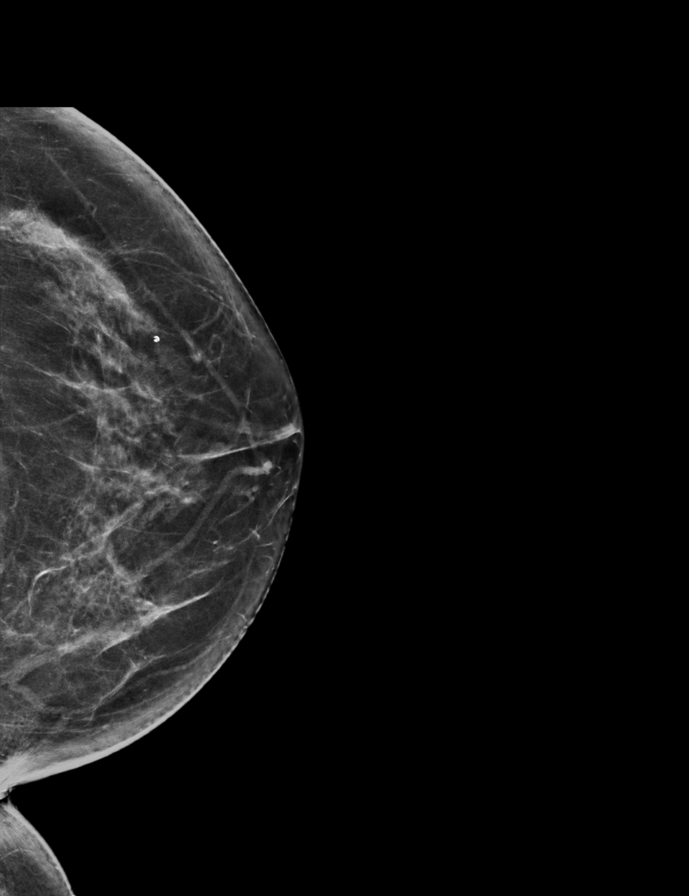

[L MLO tomo · 2 of 65 frames shown]
[frame 21/65]
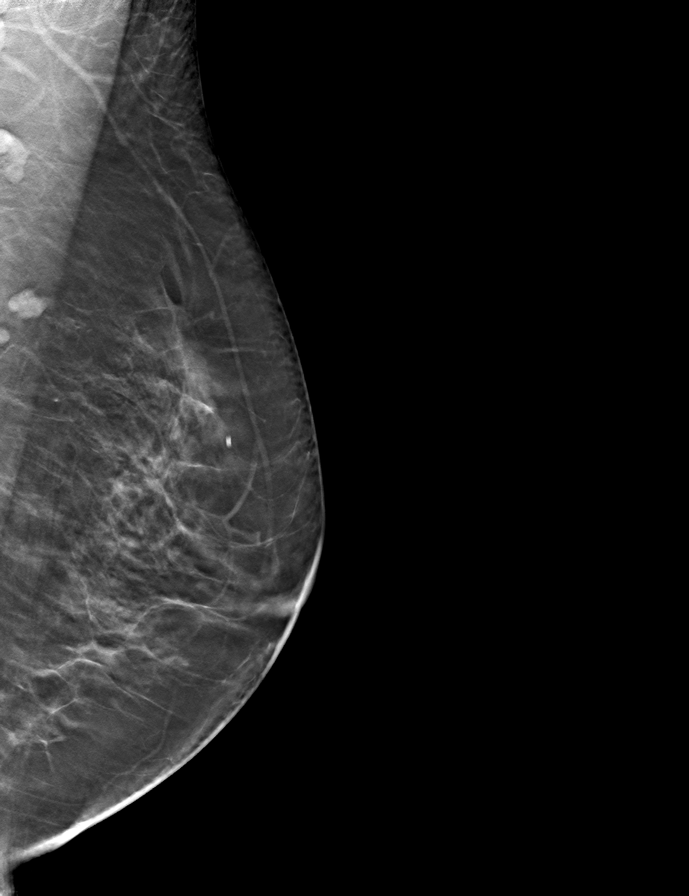
[frame 33/65]
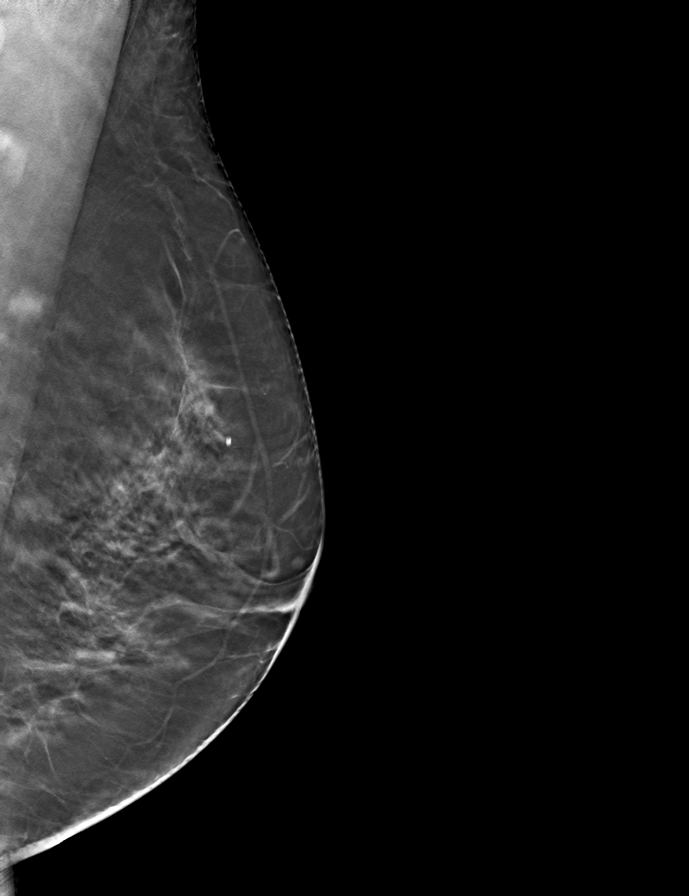

[L CC tomo · tomo slice 33/66.0]
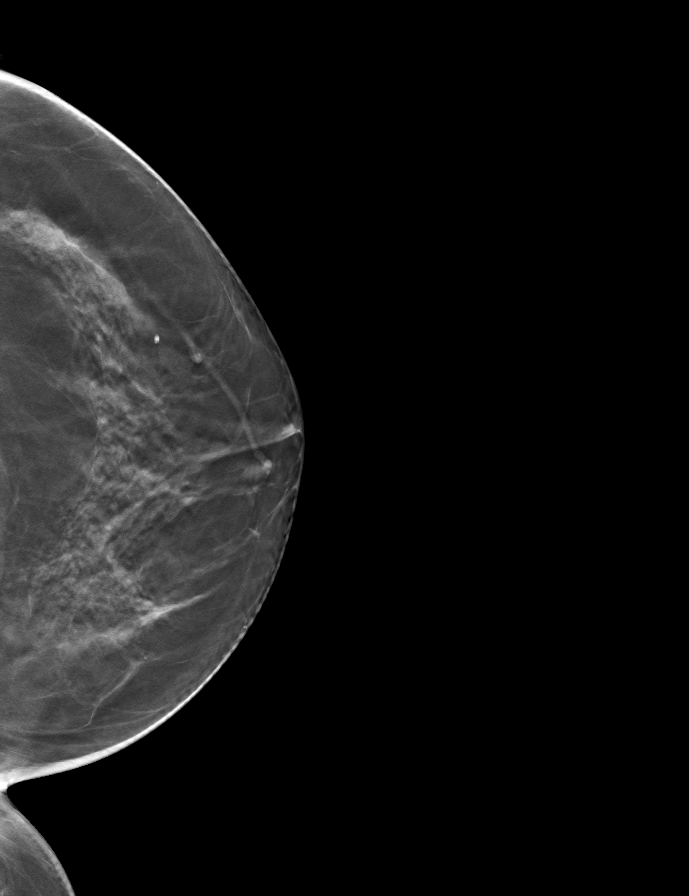

[R CC tomo · tomo slice 33/64.0]
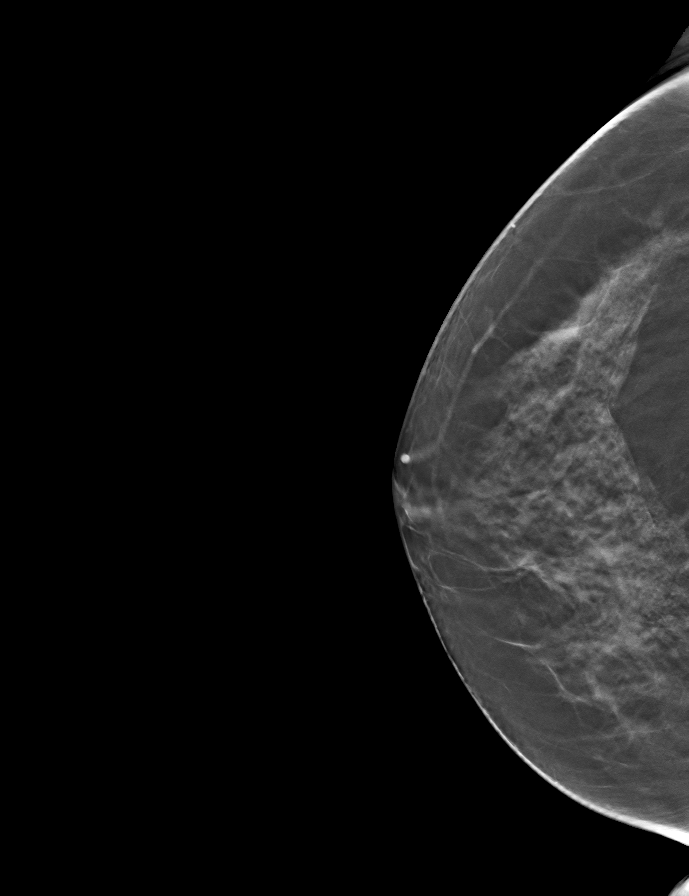

[R MLO tomo · tomo slice 31/62.0]
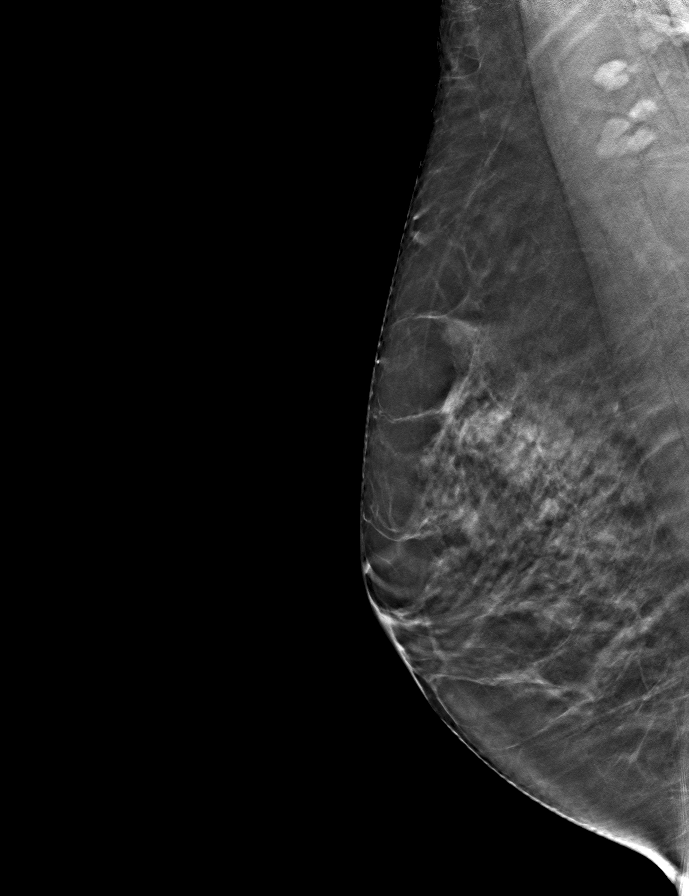

[9 of 24 positions shown; findings below may reference images not displayed]

ACR Breast Density Category c: The breast tissue is heterogeneously
dense, which may obscure small masses.
FINDINGS: There are no findings suspicious for malignancy.
IMPRESSION: No mammographic evidence of malignancy. A result letter of this
screening mammogram will be mailed directly to the patient.

RECOMMENDATION:
Screening mammogram in one year. (Code:Q3-W-BC3)

BI-RADS CATEGORY  1: Negative.

## 2021-10-28 ENCOUNTER — Other Ambulatory Visit: Payer: Self-pay | Admitting: Neurology

## 2022-02-01 ENCOUNTER — Other Ambulatory Visit (HOSPITAL_COMMUNITY): Payer: Self-pay | Admitting: Adult Health

## 2022-02-01 DIAGNOSIS — Z1231 Encounter for screening mammogram for malignant neoplasm of breast: Secondary | ICD-10-CM

## 2022-04-01 ENCOUNTER — Ambulatory Visit (HOSPITAL_COMMUNITY): Payer: BC Managed Care – PPO

## 2022-04-01 ENCOUNTER — Ambulatory Visit: Payer: BC Managed Care – PPO | Admitting: Adult Health

## 2022-04-13 ENCOUNTER — Ambulatory Visit: Payer: BC Managed Care – PPO | Admitting: Adult Health

## 2022-06-03 ENCOUNTER — Ambulatory Visit (HOSPITAL_COMMUNITY)
Admission: RE | Admit: 2022-06-03 | Discharge: 2022-06-03 | Disposition: A | Discharge status: Home / Self Care | Payer: BC Managed Care – PPO | Source: Ambulatory Visit | Attending: Adult Health | Admitting: Adult Health

## 2022-06-03 ENCOUNTER — Encounter: Payer: Self-pay | Admitting: Adult Health

## 2022-06-03 ENCOUNTER — Ambulatory Visit (INDEPENDENT_AMBULATORY_CARE_PROVIDER_SITE_OTHER): Payer: BC Managed Care – PPO | Admitting: Adult Health

## 2022-06-03 VITALS — BP 125/81 | HR 72 | Ht 69.0 in | Wt 154.0 lb

## 2022-06-03 DIAGNOSIS — F419 Anxiety disorder, unspecified: Secondary | ICD-10-CM | POA: Diagnosis not present

## 2022-06-03 DIAGNOSIS — Z1231 Encounter for screening mammogram for malignant neoplasm of breast: Secondary | ICD-10-CM | POA: Insufficient documentation

## 2022-06-03 DIAGNOSIS — Z1211 Encounter for screening for malignant neoplasm of colon: Secondary | ICD-10-CM

## 2022-06-03 DIAGNOSIS — Z01419 Encounter for gynecological examination (general) (routine) without abnormal findings: Secondary | ICD-10-CM | POA: Diagnosis not present

## 2022-06-03 DIAGNOSIS — Z780 Asymptomatic menopausal state: Secondary | ICD-10-CM | POA: Diagnosis not present

## 2022-06-03 DIAGNOSIS — F32A Depression, unspecified: Secondary | ICD-10-CM

## 2022-06-03 LAB — HEMOCCULT GUIAC POC 1CARD (OFFICE): Fecal Occult Blood, POC: NEGATIVE

## 2022-06-03 MED ORDER — ESCITALOPRAM OXALATE 10 MG PO TABS: 10.0000 mg | ORAL_TABLET | Freq: Every day | ORAL | 3 refills | Status: DC

## 2022-06-03 NOTE — Progress Notes (Signed)
Patient ID: Tina Galloway, female   DOB: 17-Feb-1966, 56 y.o.   MRN: [id number] History of Present Illness: Tina Galloway is a 56 year old white female,married,PM in for a well woman gyn exam.  She had mammogram this morning. She has been more irritable.occasional hot flash or night sweat.  PCP is Dr Sallee Lange.   Current Medications, Allergies, Past Medical History, Past Surgical History, Family History and Social History were reviewed in [employer] record.     Review of Systems: Patient denies any headaches, hearing loss, fatigue, blurred vision, shortness of breath, chest pain, abdominal pain, problems with bowel movements, urination, or intercourse. No joint pain or mood swings.  Denies any vaginal bleeding See HPI for positive.   Physical Exam:BP 125/81 (BP Location: Left Arm, Patient Position: Sitting, Cuff Size: Normal)   Pulse 72   Ht 5\' 9"  (1.753 m)   Wt 154 lb (69.9 kg)   LMP  (LMP Unknown)   BMI 22.74 kg/m   General:  Well developed, well nourished, no acute distress Skin:  Warm and dry Neck:  Midline trachea, normal thyroid, good ROM, no lymphadenopathy Lungs; Clear to auscultation bilaterally Breast:  No dominant palpable mass, retraction, or nipple discharge Cardiovascular: Regular rate and rhythm Abdomen:  Soft, non tender, no hepatosplenomegaly Pelvic:  External genitalia is normal in appearance, no lesions.  The vagina is pale. Urethra has no lesions or masses. The cervix is bulbous.  Uterus is felt to be normal size, shape, and contour.  No adnexal masses or tenderness noted.Bladder is non tender, no masses felt. Rectal: Good sphincter tone, no polyps, or hemorrhoids felt.  Hemoccult negative. Extremities/musculoskeletal:  No swelling or varicosities noted, no clubbing or cyanosis Psych:  No mood changes, alert and cooperative,seems happy AA is 6 Fall risk is low    06/03/2022   11:35 AM 02/23/2021    8:44 AM 09/21/2020   10:52 AM   Depression screen PHQ 2/9  Decreased Interest 2 0 0  Down, Depressed, Hopeless 2 0 0  PHQ - 2 Score 4 0 0  Altered sleeping 2 1   Tired, decreased energy 1 1   Change in appetite 1 1   Feeling bad or failure about yourself  2 1   Trouble concentrating 1 0   Moving slowly or fidgety/restless 0 0   Suicidal thoughts 0 0   PHQ-9 Score 11 4        06/03/2022   11:36 AM 02/23/2021    8:44 AM  GAD 7 : Generalized Anxiety Score  Nervous, Anxious, on Edge 3 0  Control/stop worrying 2 1  Worry too much - different things 3 1  Trouble relaxing 2 0  Restless 2 0  Easily annoyed or irritable 3 1  Afraid - awful might happen 0 1  Total GAD 7 Score 15 4      Upstream - 06/03/22 1134       Pregnancy Intention Screening   Does the patient want to become pregnant in the next year? N/A    Does the patient's partner want to become pregnant in the next year? N/A    Would the patient like to discuss contraceptive options today? N/A      Contraception Wrap Up   Current Method No Method - Other Reason   postmenopausal   End Method No Method - Other Reason   postmenopausal   Contraception Counseling Provided No  Examination chaperoned by Malachy Mood LPN   Impression and Plan: 1. Encounter for well woman exam with routine gynecological exam Physical in 1 year Pap in 2025 Will check fasting labs today Had mammogram today Had colonoscopy  04/12/2019 - CBC - Comprehensive metabolic panel - TSH - Lipid panel  2. Encounter for screening fecal occult blood testing Hemoccult was negative  - POCT occult blood stool  3. Postmenopause  4. Anxiety and depression More irritable, she wants to try meds Will rx lexapro Meds ordered this encounter  Medications   escitalopram (LEXAPRO) 10 MG tablet    Sig: Take 1 tablet (10 mg total) by mouth daily.    Dispense:  30 tablet    Refill:  3    Order Specific Question:   Supervising Provider    Answer:   Duane Lope H [[id number]]    Follow up in 8 weeks for ROS

## 2022-06-04 LAB — COMPREHENSIVE METABOLIC PANEL
ALT: 22 IU/L (ref 0–32)
AST: 30 IU/L (ref 0–40)
Albumin/Globulin Ratio: 2.1 (ref 1.2–2.2)
Albumin: 5 g/dL — ABNORMAL HIGH (ref 3.8–4.9)
Alkaline Phosphatase: 89 IU/L (ref 44–121)
BUN/Creatinine Ratio: 21 (ref 9–23)
BUN: 15 mg/dL (ref 6–24)
Bilirubin Total: 0.4 mg/dL (ref 0.0–1.2)
CO2: 20 mmol/L (ref 20–29)
Calcium: 10 mg/dL (ref 8.7–10.2)
Chloride: 105 mmol/L (ref 96–106)
Creatinine, Ser: 0.71 mg/dL (ref 0.57–1.00)
Globulin, Total: 2.4 g/dL (ref 1.5–4.5)
Glucose: 85 mg/dL (ref 70–99)
Potassium: 4.3 mmol/L (ref 3.5–5.2)
Sodium: 142 mmol/L (ref 134–144)
Total Protein: 7.4 g/dL (ref 6.0–8.5)
eGFR: 102 mL/min/{1.73_m2} (ref >59)

## 2022-06-04 LAB — LIPID PANEL
Chol/HDL Ratio: 1.6 ratio (ref 0.0–4.4)
Cholesterol, Total: 214 mg/dL — ABNORMAL HIGH (ref 100–199)
HDL: 131 mg/dL (ref >39)
LDL Chol Calc (NIH): 67 mg/dL (ref 0–99)
Triglycerides: 96 mg/dL (ref 0–149)
VLDL Cholesterol Cal: 16 mg/dL (ref 5–40)

## 2022-06-04 LAB — CBC
Hematocrit: 38.8 % (ref 34.0–46.6)
Hemoglobin: 13.3 g/dL (ref 11.1–15.9)
MCH: 33.2 pg — ABNORMAL HIGH (ref 26.6–33.0)
MCHC: 34.3 g/dL (ref 31.5–35.7)
MCV: 97 fL (ref 79–97)
Platelets: 238 10*3/uL (ref 150–450)
RBC: 4.01 x10E6/uL (ref 3.77–5.28)
RDW: 11.4 % — ABNORMAL LOW (ref 11.7–15.4)
WBC: 9.8 10*3/uL (ref 3.4–10.8)

## 2022-06-04 LAB — TSH: TSH: 0.351 u[IU]/mL — ABNORMAL LOW (ref 0.450–4.500)

## 2022-06-06 ENCOUNTER — Other Ambulatory Visit: Payer: Self-pay | Admitting: Adult Health

## 2022-06-06 DIAGNOSIS — R7989 Other specified abnormal findings of blood chemistry: Secondary | ICD-10-CM

## 2022-06-06 NOTE — Progress Notes (Signed)
Ck TSH and free T4 in about 8 weeks

## 2022-06-25 ENCOUNTER — Other Ambulatory Visit: Payer: Self-pay | Admitting: Adult Health

## 2022-07-29 ENCOUNTER — Ambulatory Visit: Payer: BC Managed Care – PPO | Admitting: Adult Health

## 2022-08-16 ENCOUNTER — Ambulatory Visit (INDEPENDENT_AMBULATORY_CARE_PROVIDER_SITE_OTHER): Payer: BC Managed Care – PPO | Admitting: Adult Health

## 2022-08-16 ENCOUNTER — Encounter: Payer: Self-pay | Admitting: Adult Health

## 2022-08-16 VITALS — BP 108/62 | HR 64 | Ht 69.0 in | Wt 160.0 lb

## 2022-08-16 DIAGNOSIS — R7989 Other specified abnormal findings of blood chemistry: Secondary | ICD-10-CM | POA: Diagnosis not present

## 2022-08-16 DIAGNOSIS — F419 Anxiety disorder, unspecified: Secondary | ICD-10-CM | POA: Diagnosis not present

## 2022-08-16 DIAGNOSIS — F32A Depression, unspecified: Secondary | ICD-10-CM

## 2022-08-16 NOTE — Progress Notes (Signed)
  Subjective:     Patient ID: Tina Galloway, female   DOB: 08-Mar-1966, 57 y.o.   MRN: [id number]  HPI Tina Galloway is a 57 year old white female,married, PM back in follow up on starting lexapro in October  and is feeling good.   Last pap was negative HPV and malignancy 02/23/21.  PCP is Tina Lange, MD.   Review of Systems Feels good, less irritable, husband says she is better   Reviewed past medical,surgical, social and family history. Reviewed medications and allergies.  Objective:   Physical Exam BP 108/62 (BP Location: Right Arm, Patient Position: Sitting, Cuff Size: Normal)   Pulse 64   Ht 5\' 9"  (1.753 m)   Wt 160 lb (72.6 kg)   LMP  (LMP Unknown)   BMI 23.63 kg/m     Skin warm and dry.  Lungs: clear to ausculation bilaterally. Cardiovascular: regular rate and rhythm.     08/16/2022    1:56 PM 06/03/2022   11:35 AM 02/23/2021    8:44 AM  Depression screen PHQ 2/9  Decreased Interest 0 2 0  Down, Depressed, Hopeless 0 2 0  PHQ - 2 Score 0 4 0  Altered sleeping 1 2 1   Tired, decreased energy 0 1 1  Change in appetite 1 1 1   Feeling bad or failure about yourself  0 2 1  Trouble concentrating 0 1 0  Moving slowly or fidgety/restless 0 0 0  Suicidal thoughts 0 0 0  PHQ-9 Score 2 11 4        08/16/2022    1:57 PM 06/03/2022   11:36 AM 02/23/2021    8:44 AM  GAD 7 : Generalized Anxiety Score  Nervous, Anxious, on Edge 0 3 0  Control/stop worrying 0 2 1  Worry too much - different things 1 3 1   Trouble relaxing 0 2 0  Restless 0 2 0  Easily annoyed or irritable 1 3 1   Afraid - awful might happen 0 0 1  Total GAD 7 Score 2 15 4       Upstream - 08/16/22 1404       Pregnancy Intention Screening   Does the patient want to become pregnant in the next year? No    Does the patient's partner want to become pregnant in the next year? No    Would the patient like to discuss contraceptive options today? No      Contraception Wrap Up   Current Method No Method - Other  Reason    Reason for No Current Contraceptive Method at Intake ([facility] Only) Other   PM   End Method No Method - Other Reason   PM   Contraception Counseling Provided No             Assessment:     1. 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 Low TSH level To get labs today    Plan:     Follow up prn

## 2022-08-17 LAB — T4, FREE: Free T4: 0.91 ng/dL (ref 0.82–1.77)

## 2022-08-17 LAB — TSH: TSH: 0.626 u[IU]/mL (ref 0.450–4.500)

## 2023-03-22 ENCOUNTER — Other Ambulatory Visit: Payer: Self-pay | Admitting: Adult Health

## 2023-05-03 ENCOUNTER — Other Ambulatory Visit (HOSPITAL_COMMUNITY): Payer: Self-pay | Admitting: Adult Health

## 2023-05-03 DIAGNOSIS — Z1231 Encounter for screening mammogram for malignant neoplasm of breast: Secondary | ICD-10-CM

## 2023-06-13 ENCOUNTER — Ambulatory Visit: Payer: BC Managed Care – PPO

## 2023-06-13 ENCOUNTER — Ambulatory Visit
Admission: EM | Admit: 2023-06-13 | Discharge: 2023-06-13 | Disposition: A | Discharge status: Home / Self Care | Payer: BC Managed Care – PPO | Attending: Nurse Practitioner | Admitting: Nurse Practitioner

## 2023-06-13 DIAGNOSIS — B9689 Other specified bacterial agents as the cause of diseases classified elsewhere: Secondary | ICD-10-CM

## 2023-06-13 DIAGNOSIS — J209 Acute bronchitis, unspecified: Secondary | ICD-10-CM

## 2023-06-13 DIAGNOSIS — J019 Acute sinusitis, unspecified: Secondary | ICD-10-CM | POA: Diagnosis not present

## 2023-06-13 MED ORDER — ALBUTEROL SULFATE HFA 108 (90 BASE) MCG/ACT IN AERS: 1.0000 | INHALATION_SPRAY | Freq: Four times a day (QID) | RESPIRATORY_TRACT | 0 refills | Status: DC | PRN

## 2023-06-13 MED ORDER — AMOXICILLIN-POT CLAVULANATE 875-125 MG PO TABS: 1.0000 | ORAL_TABLET | Freq: Two times a day (BID) | ORAL | 0 refills | Status: AC

## 2023-06-13 MED ORDER — ALBUTEROL SULFATE (2.5 MG/3ML) 0.083% IN NEBU
2.5000 mg | INHALATION_SOLUTION | Freq: Once | RESPIRATORY_TRACT | Status: AC
  Administered 2023-06-13: 2.5 mg via RESPIRATORY_TRACT

## 2023-06-13 MED ORDER — PREDNISONE 20 MG PO TABS: 20.0000 mg | ORAL_TABLET | Freq: Every day | ORAL | 0 refills | Status: AC

## 2023-06-13 NOTE — ED Triage Notes (Signed)
Pt c/o coiguh nasal congestion and sinus pressure pt states she is coughing up yellowish mucus. Having difficulty breaking up the mucus, has tried OTC sudafed and Dayquil no relief.

## 2023-06-13 NOTE — Discharge Instructions (Signed)
You have a sinus infection.  I am also concerned you may have a lower respiratory infection.  The chest xray is pending and I will contact you later today if it shows pneumonia.  In the meantime, start taking the Augmentin to treat both a sinus infection and a lower respiratory infection.  Symptoms should improve over the next week to 10 days.  If you develop chest pain or shortness of breath, go to the emergency room.  We gave you a breathing treatment today.  Continue to use albuterol inhaler at home every 4-6 hours as needed for wheezing or shortness of breath.  Also start taking the oral prednisone to help with lung inflammation.  Some things that can make you feel better are: - Increased rest - Increasing fluid with water/sugar free electrolytes - Acetaminophen and ibuprofen as needed for fever/pain - OTC guaifenesin (Mucinex) 600 mg twice daily for congestion - Saline sinus flushes or a neti pot for congestion - Humidifying the air

## 2023-06-13 NOTE — ED Provider Notes (Signed)
[facility]    CSN: [id number] Arrival date & time: 06/13/23  7829      History   Chief Complaint No chief complaint on file.   HPI Tina Galloway is a 57 y.o. female.   Patient presents today with 3-day history of low-grade fevers, congested cough with thick, yellow mucus, intermittent chest tightness and wheezing, runny and stuffy nose, sinus pressure and headache, bilateral ear popping, decreased appetite, and fatigue.  She denies chest pain, shortness of breath, sore throat, abdominal pain, nausea/vomiting, or diarrhea.  Has been taking DayQuil, NyQuil, and Sudafed for symptoms without much improvement.  Reports she typically gets a sinus infection around the same time every year in the fall, usually gets better after taking antibiotics and/or steroids.  Patient denies vaping.  Reports that she is a social smoker but does not smoke daily.  Has had bronchitis before and has needed inhalers when sick.    Past Medical History:  Diagnosis Date   Abnormal Pap smear    Cervical radiculopathy    Cervical spondylosis    Hormone replacement therapy (HRT) 12/14/2015   Menopausal symptoms 06/04/2013   Trigeminal neuralgia    Vaginal Pap smear, abnormal     Patient Active Problem List   Diagnosis Date Noted   Low TSH level 08/16/2022   Encounter for well woman exam with routine gynecological exam 06/03/2022   Anxiety and depression 06/03/2022   Encounter for screening fecal occult blood testing 02/23/2021   Encounter for gynecological examination with Papanicolaou smear of cervix 02/23/2021   Postmenopause 02/23/2021   Chronic migraine w/o aura w/o status migrainosus, not intractable 10/01/2020   Right facial numbness 10/01/2020   Screening for colorectal cancer 09/03/2018   Special screening for malignant neoplasms, colon 09/03/2018   Hot flashes 01/24/2018   Weight gain 01/24/2018   Hormone replacement therapy (HRT) 12/14/2015   Menopausal symptoms  06/04/2013    Past Surgical History:  Procedure Laterality Date   COLONOSCOPY N/A 04/12/2019   Procedure: COLONOSCOPY;  Surgeon: West Bali, MD;  Location: [facility];  Service: Endoscopy;  Laterality: N/A;  9:30   ENDOSCOPIC CONCHA BULLOSA RESECTION Bilateral 03/20/2020   Procedure: WITH CONCHA BULLOSA RESECTION;  Surgeon: Newman Pies, MD;  Location: [facility];  Service: ENT;  Laterality: Bilateral;   NASAL SEPTOPLASTY W/ TURBINOPLASTY Bilateral 03/20/2020   Procedure: NASAL SEPTOPLASTY WITH TURBINATE REDUCTION;  Surgeon: Newman Pies, MD;  Location: [facility];  Service: ENT;  Laterality: Bilateral;   POLYPECTOMY  04/12/2019   Procedure: POLYPECTOMY;  Surgeon: West Bali, MD;  Location: [facility];  Service: Endoscopy;;   TONSILLECTOMY AND ADENOIDECTOMY      OB History     Gravida  2   Para  2   Term      Preterm      AB      Living  2      SAB      IAB      Ectopic      Multiple      Live Births               Home Medications    Prior to Admission medications   Medication Sig Start Date End Date Taking? Authorizing Provider  albuterol (VENTOLIN HFA) 108 (90 Base) MCG/ACT inhaler Inhale 1-2 puffs into the lungs every 6 (six) hours as needed for wheezing or shortness of breath. 06/13/23  Yes Valentino Nose, NP  amoxicillin-clavulanate (AUGMENTIN) 875-125 MG tablet Take 1 tablet by mouth 2 (two) times daily for 7 days. 06/13/23 06/20/23 Yes Valentino Nose, NP  predniSONE (DELTASONE) 20 MG tablet Take 1 tablet (20 mg total) by mouth daily with breakfast for 5 days. 06/13/23 06/18/23 Yes Valentino Nose, NP  aspirin-acetaminophen-caffeine (EXCEDRIN MIGRAINE) [phone] MG tablet as needed.    [provider]  BIOTIN FORTE PO Take by mouth. Dose unknown    [provider]  escitalopram (LEXAPRO) 10 MG tablet TAKE 1 TABLET BY MOUTH EVERY DAY 03/22/23   Adline Potter, NP  Multiple Vitamin  (MULTIVITAMIN) tablet Take 1 tablet by mouth daily.    [provider]  SUMAtriptan (IMITREX) 50 MG tablet Take 1 tablet (50 mg total) by mouth every 2 (two) hours as needed. May repeat in 2 hours if headache persists or recurs. 10/01/20   Levert Feinstein, MD  UNKNOWN TO PATIENT Fiber tab    [provider]    Family History Family History  Problem Relation Age of Onset   Cancer Mother 6       breast     Social History Social History   Tobacco Use   Smoking status: Light Smoker    Types: Cigarettes   Smokeless tobacco: Never   Tobacco comments:    socially smokes-on weekends  Vaping Use   Vaping status: Never Used  Substance Use Topics   Alcohol use: Yes    Comment: socially   Drug use: No     Allergies   Patient has no known allergies.   Review of Systems Review of Systems Per HPI  Physical Exam Triage Vital Signs ED Triage Vitals  Encounter Vitals Group     BP 06/13/23 0945 128/80     Systolic BP Percentile --      Diastolic BP Percentile --      Pulse Rate 06/13/23 0945 71     Resp 06/13/23 0945 16     Temp 06/13/23 0945 99.1 F (37.3 C)     Temp Source 06/13/23 0945 Oral     SpO2 06/13/23 0945 93 %     Weight --      Height --      Head Circumference --      Peak Flow --      Pain Score 06/13/23 0948 7     Pain Loc --      Pain Education --      Exclude from Growth Chart --    No data found.  Updated Vital Signs BP 128/80 (BP Location: Right Arm)   Pulse 71   Temp 99.1 F (37.3 C) (Oral)   Resp 16   LMP  (LMP Unknown)   SpO2 93%   Visual Acuity Right Eye Distance:   Left Eye Distance:   Bilateral Distance:    Right Eye Near:   Left Eye Near:    Bilateral Near:     Physical Exam Vitals and nursing note reviewed.  Constitutional:      General: She is not in acute distress.    Appearance: Normal appearance. She is not ill-appearing or toxic-appearing.  HENT:     Head: Normocephalic and atraumatic.     Right Ear:  Tympanic membrane, ear canal and external ear normal.     Left Ear: Tympanic membrane, ear canal and external ear normal.     Nose: Congestion and rhinorrhea present.     Right Sinus: Maxillary sinus tenderness and frontal sinus  tenderness present.     Left Sinus: Maxillary sinus tenderness and frontal sinus tenderness present.     Mouth/Throat:     Mouth: Mucous membranes are moist.     Pharynx: Oropharynx is clear. Posterior oropharyngeal erythema present. No oropharyngeal exudate.  Eyes:     General: No scleral icterus.    Extraocular Movements: Extraocular movements intact.  Cardiovascular:     Rate and Rhythm: Normal rate and regular rhythm.  Pulmonary:     Effort: Pulmonary effort is normal. No respiratory distress.     Breath sounds: Wheezing present. No rhonchi or rales.  Musculoskeletal:     Cervical back: Normal range of motion and neck supple.  Lymphadenopathy:     Cervical: Cervical adenopathy present.  Skin:    General: Skin is warm and dry.     Capillary Refill: Capillary refill takes less than 2 seconds.     Coloration: Skin is not jaundiced or pale.     Findings: No erythema or rash.  Neurological:     Mental Status: She is alert and oriented to person, place, and time.  Psychiatric:        Behavior: Behavior is cooperative.      UC Treatments / Results  Labs (all labs ordered are listed, but only abnormal results are displayed) Labs Reviewed - No data to display  EKG   Radiology No results found.  Procedures Procedures (including critical care time)  Medications Ordered in UC Medications  albuterol (PROVENTIL) (2.5 MG/3ML) 0.083% nebulizer solution 2.5 mg (2.5 mg Nebulization Given 06/13/23 1005)    Initial Impression / Assessment and Plan / UC Course  I have reviewed the triage vital signs and the nursing notes.  Pertinent labs & imaging results that were available during my care of the patient were reviewed by me and considered in my medical  decision making (see chart for details).   Patient is well-appearing, normotensive, afebrile, not tachycardic, not tachypneic, oxygenating well on room air.    1. Acute bacterial sinusitis Treat with Augmentin twice for 7 days Recommended Mucinex, saline nasal spray and nasal saline rinses Return and ER precautions discussed  2. Acute bronchitis, unspecified organism Albuterol breathing treatment given with improvement in air movement bilaterally; patient reported subjective improvement in chest tightness Chest x-ray pending at time of discharge; I reviewed the x-ray and did not see any obvious signs of pneumonia Will contact patient if formal x-ray results results in a change in plan of care In the meantime, start albuterol inhaler at home every 4-6 hours as needed, start oral prednisone Strict ER and return precautions discussed with patient  The patient was given the opportunity to ask questions.  All questions answered to their satisfaction.  The patient is in agreement to this plan.    Final Clinical Impressions(s) / UC Diagnoses   Final diagnoses:  Acute bacterial sinusitis  Acute bronchitis, unspecified organism     Discharge Instructions      You have a sinus infection.  I am also concerned you may have a lower respiratory infection.  The chest xray is pending and I will contact you later today if it shows pneumonia.  In the meantime, start taking the Augmentin to treat both a sinus infection and a lower respiratory infection.  Symptoms should improve over the next week to 10 days.  If you develop chest pain or shortness of breath, go to the emergency room.  We gave you a breathing treatment today.  Continue to use  albuterol inhaler at home every 4-6 hours as needed for wheezing or shortness of breath.  Also start taking the oral prednisone to help with lung inflammation.  Some things that can make you feel better are: - Increased rest - Increasing fluid with water/sugar  free electrolytes - Acetaminophen and ibuprofen as needed for fever/pain - OTC guaifenesin (Mucinex) 600 mg twice daily for congestion - Saline sinus flushes or a neti pot for congestion - Humidifying the air     ED Prescriptions     Medication Sig Dispense Auth. Provider   albuterol (VENTOLIN HFA) 108 (90 Base) MCG/ACT inhaler Inhale 1-2 puffs into the lungs every 6 (six) hours as needed for wheezing or shortness of breath. 18 g Cathlean Marseilles A, NP   predniSONE (DELTASONE) 20 MG tablet Take 1 tablet (20 mg total) by mouth daily with breakfast for 5 days. 5 tablet Cathlean Marseilles A, NP   amoxicillin-clavulanate (AUGMENTIN) 875-125 MG tablet Take 1 tablet by mouth 2 (two) times daily for 7 days. 14 tablet Valentino Nose, NP      PDMP not reviewed this encounter.   Valentino Nose, NP 06/13/23 [phone]

## 2023-07-12 ENCOUNTER — Ambulatory Visit (HOSPITAL_COMMUNITY)
Admission: RE | Admit: 2023-07-12 | Discharge: 2023-07-12 | Disposition: A | Discharge status: Home / Self Care | Payer: BC Managed Care – PPO | Source: Ambulatory Visit | Attending: Adult Health | Admitting: Adult Health

## 2023-07-12 DIAGNOSIS — Z1231 Encounter for screening mammogram for malignant neoplasm of breast: Secondary | ICD-10-CM | POA: Diagnosis present

## 2023-07-18 ENCOUNTER — Other Ambulatory Visit: Payer: Self-pay | Admitting: Nurse Practitioner

## 2023-07-20 NOTE — Telephone Encounter (Signed)
Urgent Care patient Requested Prescriptions  Pending Prescriptions Disp Refills   albuterol (VENTOLIN HFA) 108 (90 Base) MCG/ACT inhaler [Pharmacy Med Name: ALBUTEROL HFA (PROVENTIL) INH] 6.7 each     Sig: INHALE 1-2 PUFFS BY MOUTH EVERY 6 HOURS AS NEEDED FOR WHEEZE OR SHORTNESS OF BREATH     Pulmonology:  Beta Agonists 2 Failed - 07/18/2023  2:34 AM      Failed - Valid encounter within last 12 months    Recent Outpatient Visits   None            Passed - Last BP in normal range    BP Readings from Last 1 Encounters:  06/13/23 128/80         Passed - Last Heart Rate in normal range    Pulse Readings from Last 1 Encounters:  06/13/23 71

## 2023-08-04 ENCOUNTER — Ambulatory Visit (HOSPITAL_COMMUNITY): Payer: BC Managed Care – PPO

## 2023-08-04 ENCOUNTER — Encounter: Payer: Self-pay | Admitting: Adult Health

## 2023-08-04 ENCOUNTER — Ambulatory Visit (INDEPENDENT_AMBULATORY_CARE_PROVIDER_SITE_OTHER): Payer: BC Managed Care – PPO | Admitting: Adult Health

## 2023-08-04 VITALS — BP 134/78 | HR 71 | Ht 69.0 in | Wt 149.0 lb

## 2023-08-04 DIAGNOSIS — F32A Depression, unspecified: Secondary | ICD-10-CM

## 2023-08-04 DIAGNOSIS — Z780 Asymptomatic menopausal state: Secondary | ICD-10-CM | POA: Diagnosis not present

## 2023-08-04 DIAGNOSIS — Z1211 Encounter for screening for malignant neoplasm of colon: Secondary | ICD-10-CM

## 2023-08-04 DIAGNOSIS — Z1331 Encounter for screening for depression: Secondary | ICD-10-CM | POA: Diagnosis not present

## 2023-08-04 DIAGNOSIS — F419 Anxiety disorder, unspecified: Secondary | ICD-10-CM | POA: Diagnosis not present

## 2023-08-04 DIAGNOSIS — Z01419 Encounter for gynecological examination (general) (routine) without abnormal findings: Secondary | ICD-10-CM

## 2023-08-04 DIAGNOSIS — Z13220 Encounter for screening for lipoid disorders: Secondary | ICD-10-CM

## 2023-08-04 DIAGNOSIS — Z1329 Encounter for screening for other suspected endocrine disorder: Secondary | ICD-10-CM | POA: Diagnosis not present

## 2023-08-04 LAB — HEMOCCULT GUIAC POC 1CARD (OFFICE): Fecal Occult Blood, POC: NEGATIVE

## 2023-08-04 NOTE — Progress Notes (Signed)
Patient ID: Jamariyah Haddix, female   DOB: March 21, 1966, 57 y.o.   MRN: [id number] History of Present Illness: Lizbhet is a 57 year old white female, married, PM in for a well woman gyn exam. She is doing well no complaints.     Component Value Date/Time   DIAGPAP  02/23/2021 4098    - Negative for intraepithelial lesion or malignancy (NILM)   DIAGPAP  09/03/2018 0000    NEGATIVE FOR INTRAEPITHELIAL LESIONS OR MALIGNANCY.   HPVHIGH Negative 02/23/2021 0922   ADEQPAP  02/23/2021 0922    Satisfactory for evaluation; transformation zone component PRESENT.   ADEQPAP  09/03/2018 0000    Satisfactory for evaluation  endocervical/transformation zone component PRESENT.   PCP is Dr Lilyan Punt   Current Medications, Allergies, Past Medical History, Past Surgical History, Family History and Social History were reviewed in [employer] electronic medical record.     Review of Systems: Patient denies any headaches, hearing loss, fatigue, blurred vision, shortness of breath, chest pain, abdominal pain, problems with bowel movements, urination, or intercourse. No joint pain or mood swings.     Physical Exam:BP 134/78 (BP Location: Left Arm, Patient Position: Sitting, Cuff Size: Normal)   Pulse 71   Ht 5\' 9"  (1.753 m)   Wt 149 lb (67.6 kg)   LMP  (LMP Unknown)   BMI 22.00 kg/m    General:  Well developed, well nourished, no acute distress Skin:  Warm and dry Neck:  Midline trachea, normal thyroid, good ROM, no lymphadenopathy Lungs; Clear to auscultation bilaterally Breast:  No dominant palpable mass, retraction, or nipple discharge Cardiovascular: Regular rate and rhythm Abdomen:  Soft, non tender, no hepatosplenomegaly Pelvic:  External genitalia is normal in appearance, no lesions.  The vagina is normal in appearance. Urethra has no lesions or masses. The cervix is smooth.   Uterus is felt to be normal size, shape, and contour.  No adnexal masses or tenderness noted.Bladder is non  tender, no masses felt. Rectal: Good sphincter tone, no polyps, or hemorrhoids felt.  Hemoccult negative. Extremities/musculoskeletal:  No swelling or varicosities noted, no clubbing or cyanosis Psych:  No mood changes, alert and cooperative,seems happy AA is 4 Fall risk is low    08/04/2023    8:30 AM 08/16/2022    1:56 PM 06/03/2022   11:35 AM  Depression screen PHQ 2/9  Decreased Interest 0 0 2  Down, Depressed, Hopeless 0 0 2  PHQ - 2 Score 0 0 4  Altered sleeping 1 1 2   Tired, decreased energy 0 0 1  Change in appetite 0 1 1  Feeling bad or failure about yourself  0 0 2  Trouble concentrating 0 0 1  Moving slowly or fidgety/restless 0 0 0  Suicidal thoughts 0 0 0  PHQ-9 Score 1 2 11        08/04/2023    8:30 AM 08/16/2022    1:57 PM 06/03/2022   11:36 AM 02/23/2021    8:44 AM  GAD 7 : Generalized Anxiety Score  Nervous, Anxious, on Edge 1 0 3 0  Control/stop worrying 0 0 2 1  Worry too much - different things 1 1 3 1   Trouble relaxing 1 0 2 0  Restless 1 0 2 0  Easily annoyed or irritable 1 1 3 1   Afraid - awful might happen 0 0 0 1  Total GAD 7 Score 5 2 15 4       Upstream - 08/04/23 1191  Pregnancy Intention Screening   Does the patient want to become pregnant in the next year? N/A    Does the patient's partner want to become pregnant in the next year? N/A    Would the patient like to discuss contraceptive options today? N/A      Contraception Wrap Up   Current Method No Method - Other Reason   postmenopausal   End Method No Method - Other Reason   postmenopausal   Contraception Counseling Provided No            Examination chaperoned by Freddie Apley RN  Impression and Plan; 1. Encounter for well woman exam with routine gynecological exam (Primary) Physical and pap in 1 year Will check labs Mammogram was negative 07/12/23 Colonoscopy per GI  - CBC - Comprehensive metabolic panel - Lipid panel Stay active.  2. Encounter for screening fecal  occult blood testing Hemoccult was negative  - POCT occult blood stool  3. Postmenopause  4. Anxiety and depression Doing well, is on lexapro 10 mg 1 daily, has refills   5. Screening for thyroid disorder - TSH + free T4  6. Screening cholesterol level - Lipid panel

## 2023-08-05 LAB — CBC
Hematocrit: 40.2 % (ref 34.0–46.6)
Hemoglobin: 13.6 g/dL (ref 11.1–15.9)
MCH: 33.7 pg — ABNORMAL HIGH (ref 26.6–33.0)
MCHC: 33.8 g/dL (ref 31.5–35.7)
MCV: 100 fL — ABNORMAL HIGH (ref 79–97)
Platelets: 287 10*3/uL (ref 150–450)
RBC: 4.03 x10E6/uL (ref 3.77–5.28)
RDW: 11.8 % (ref 11.7–15.4)
WBC: 7.2 10*3/uL (ref 3.4–10.8)

## 2023-08-05 LAB — COMPREHENSIVE METABOLIC PANEL
ALT: 19 [IU]/L (ref 0–32)
AST: 24 [IU]/L (ref 0–40)
Albumin: 4.7 g/dL (ref 3.8–4.9)
Alkaline Phosphatase: 96 [IU]/L (ref 44–121)
BUN/Creatinine Ratio: 19 (ref 9–23)
BUN: 13 mg/dL (ref 6–24)
Bilirubin Total: 0.3 mg/dL (ref 0.0–1.2)
CO2: 21 mmol/L (ref 20–29)
Calcium: 9.6 mg/dL (ref 8.7–10.2)
Chloride: 104 mmol/L (ref 96–106)
Creatinine, Ser: 0.68 mg/dL (ref 0.57–1.00)
Globulin, Total: 2.5 g/dL (ref 1.5–4.5)
Glucose: 81 mg/dL (ref 70–99)
Potassium: 4.9 mmol/L (ref 3.5–5.2)
Sodium: 140 mmol/L (ref 134–144)
Total Protein: 7.2 g/dL (ref 6.0–8.5)
eGFR: 103 mL/min/{1.73_m2} (ref >59)

## 2023-08-05 LAB — LIPID PANEL
Chol/HDL Ratio: 1.9 {ratio} (ref 0.0–4.4)
Cholesterol, Total: 201 mg/dL — ABNORMAL HIGH (ref 100–199)
HDL: 107 mg/dL (ref >39)
LDL Chol Calc (NIH): 72 mg/dL (ref 0–99)
Triglycerides: 132 mg/dL (ref 0–149)
VLDL Cholesterol Cal: 22 mg/dL (ref 5–40)

## 2023-08-05 LAB — TSH+FREE T4
Free T4: 1.03 ng/dL (ref 0.82–1.77)
TSH: 0.372 u[IU]/mL — ABNORMAL LOW (ref 0.450–4.500)

## 2023-09-20 ENCOUNTER — Ambulatory Visit
Admission: EM | Admit: 2023-09-20 | Discharge: 2023-09-20 | Disposition: A | Discharge status: Home / Self Care | Payer: BC Managed Care – PPO | Attending: Nurse Practitioner | Admitting: Nurse Practitioner

## 2023-09-20 ENCOUNTER — Ambulatory Visit (INDEPENDENT_AMBULATORY_CARE_PROVIDER_SITE_OTHER): Payer: BC Managed Care – PPO

## 2023-09-20 ENCOUNTER — Encounter: Payer: Self-pay | Admitting: Emergency Medicine

## 2023-09-20 ENCOUNTER — Telehealth: Payer: Self-pay

## 2023-09-20 ENCOUNTER — Other Ambulatory Visit: Payer: Self-pay

## 2023-09-20 DIAGNOSIS — M549 Dorsalgia, unspecified: Secondary | ICD-10-CM

## 2023-09-20 MED ORDER — KETOROLAC TROMETHAMINE 30 MG/ML IJ SOLN
30.0000 mg | Freq: Once | INTRAMUSCULAR | Status: AC
  Administered 2023-09-20: 30 mg via INTRAMUSCULAR

## 2023-09-20 MED ORDER — DEXAMETHASONE SODIUM PHOSPHATE 10 MG/ML IJ SOLN
10.0000 mg | INTRAMUSCULAR | Status: AC
  Administered 2023-09-20: 10 mg via INTRAMUSCULAR

## 2023-09-20 MED ORDER — CYCLOBENZAPRINE HCL 5 MG PO TABS: 5.0000 mg | ORAL_TABLET | Freq: Two times a day (BID) | ORAL | 0 refills | Status: DC

## 2023-09-20 NOTE — Discharge Instructions (Addendum)
 X-rays of your mid and lower back are pending.  As discussed, you will be contacted when the results of the x-ray are received. You were given injections of Decadron  10 mg and Toradol  30 mg today.  Do not take any additional NSAIDs such as ibuprofen , Aleve , Advil , or naproxen .  Recommend over-the-counter Tylenol  Extra Strength for breakthrough pain or discomfort. Take medication as prescribed.  I have prescribed a muscle relaxer for you at bedtime.  Please be advised that the medication will cause drowsiness.  No driving, operating heavy equipment, or drinking alcohol while taking the medication. Apply ice or heat to the affected area.  Apply ice for pain or swelling, heat for spasm or stiffness.  Apply for 20 minutes, remove for 1 hour, repeat as needed. Try to remain as active as possible. Go to the emergency department immediately if you experience worsening back pain, develop trouble breathing, develop loss of bowel or bladder function, or other concerns. Symptoms should begin improving over the next week.  If symptoms fail to improve, or appear to be worsening, it is recommended that you follow-up with orthopedics or your primary care physician for further evaluation. Follow-up as needed.

## 2023-09-20 NOTE — ED Triage Notes (Signed)
 Pt reports slipped getting out of shower on Saturday. Pt reports right flank and lower back pain ever since. Denies hitting head or loc, numbness/tingling in extremities.

## 2023-09-20 NOTE — Telephone Encounter (Signed)
 Pt has been contacted with x-ray results and recommendations. Pt has verbalized understanding.

## 2023-09-20 NOTE — ED Provider Notes (Signed)
 [facility]    CSN: [id number] Arrival date & time: 09/20/23  9093      History   Chief Complaint Chief Complaint  Patient presents with   Fall    HPI Tina Galloway is a 58 y.o. female.   The history is provided by the patient.   Patient presents for complaints of right sided mid and low back pain after she fell approximately 4 days ago.  Patient states she was stepping out of the shower when the mat slid, causing patient to land on her right side.  She states that she felt the symptoms were starting to improve, but over the past 24 hours, she experienced increasing back pain.  She does have a bruising to the right mid back.  Denies numbness, tingling, radiation of pain, loss of bowel or bladder function, saddle anesthesia, or history of back pain.  Reports she has been taking over-the-counter analgesics for pain.  Past Medical History:  Diagnosis Date   Abnormal Pap smear    Cervical radiculopathy    Cervical spondylosis    Hormone replacement therapy (HRT) 12/14/2015   Menopausal symptoms 06/04/2013   Trigeminal neuralgia    Vaginal Pap smear, abnormal     Patient Active Problem List   Diagnosis Date Noted   Low TSH level 08/16/2022   Encounter for well woman exam with routine gynecological exam 06/03/2022   Anxiety and depression 06/03/2022   Encounter for screening fecal occult blood testing 02/23/2021   Encounter for gynecological examination with Papanicolaou smear of cervix 02/23/2021   Postmenopause 02/23/2021   Chronic migraine w/o aura w/o status migrainosus, not intractable 10/01/2020   Right facial numbness 10/01/2020   Screening for colorectal cancer 09/03/2018   Special screening for malignant neoplasms, colon 09/03/2018   Hot flashes 01/24/2018   Weight gain 01/24/2018   Hormone replacement therapy (HRT) 12/14/2015   Menopausal symptoms 06/04/2013    Past Surgical History:  Procedure Laterality Date   COLONOSCOPY N/A 04/12/2019    Procedure: COLONOSCOPY;  Surgeon: Harvey Margo LITTIE, MD;  Location: [facility];  Service: Endoscopy;  Laterality: N/A;  9:30   ENDOSCOPIC CONCHA BULLOSA RESECTION Bilateral 03/20/2020   Procedure: WITH CONCHA BULLOSA RESECTION;  Surgeon: Karis Clunes, MD;  Location: [facility];  Service: ENT;  Laterality: Bilateral;   NASAL SEPTOPLASTY W/ TURBINOPLASTY Bilateral 03/20/2020   Procedure: NASAL SEPTOPLASTY WITH TURBINATE REDUCTION;  Surgeon: Karis Clunes, MD;  Location: [facility];  Service: ENT;  Laterality: Bilateral;   POLYPECTOMY  04/12/2019   Procedure: POLYPECTOMY;  Surgeon: Harvey Margo LITTIE, MD;  Location: [facility];  Service: Endoscopy;;   TONSILLECTOMY AND ADENOIDECTOMY      OB History     Gravida  2   Para  2   Term      Preterm      AB      Living  2      SAB      IAB      Ectopic      Multiple      Live Births               Home Medications    Prior to Admission medications   Medication Sig Start Date End Date Taking? Authorizing Provider  albuterol  (VENTOLIN  HFA) 108 (90 Base) MCG/ACT inhaler Inhale 1-2 puffs into the lungs every 6 (six) hours as needed for wheezing or shortness of breath. Patient not taking: Reported on 08/04/2023  06/13/23   Chandra Harlene LABOR, NP  aspirin-acetaminophen -caffeine (EXCEDRIN MIGRAINE) 250-250-65 MG tablet as needed. Patient not taking: Reported on 08/04/2023    [provider]  BIOTIN FORTE PO Take by mouth. Dose unknown    [provider]  escitalopram  (LEXAPRO ) 10 MG tablet TAKE 1 TABLET BY MOUTH EVERY DAY 03/22/23   Signa Delon LABOR, NP  Multiple Vitamin (MULTIVITAMIN) tablet Take 1 tablet by mouth daily.    [provider]  SUMAtriptan  (IMITREX ) 50 MG tablet Take 1 tablet (50 mg total) by mouth every 2 (two) hours as needed. May repeat in 2 hours if headache persists or recurs. Patient not taking: Reported on 08/04/2023 10/01/20   Onita Duos, MD  UNKNOWN TO PATIENT  Fiber tab    [provider]    Family History Family History  Problem Relation Age of Onset   Cancer Mother 65       breast     Social History Social History   Tobacco Use   Smoking status: Light Smoker    Types: Cigarettes   Smokeless tobacco: Never   Tobacco comments:    socially smokes-on weekends  Vaping Use   Vaping status: Never Used  Substance Use Topics   Alcohol use: Yes    Comment: socially   Drug use: No     Allergies   Patient has no known allergies.   Review of Systems Review of Systems Per HPI  Physical Exam Triage Vital Signs ED Triage Vitals [09/20/23 1001]  Encounter Vitals Group     BP (!) 148/88     Systolic BP Percentile      Diastolic BP Percentile      Pulse Rate 71     Resp 20     Temp 98.2 F (36.8 C)     Temp Source Oral     SpO2 96 %     Weight      Height      Head Circumference      Peak Flow      Pain Score 10     Pain Loc      Pain Education      Exclude from Growth Chart    No data found.  Updated Vital Signs BP (!) 148/88 (BP Location: Right Arm)   Pulse 71   Temp 98.2 F (36.8 C) (Oral)   Resp 20   LMP  (LMP Unknown)   SpO2 96%   Visual Acuity Right Eye Distance:   Left Eye Distance:   Bilateral Distance:    Right Eye Near:   Left Eye Near:    Bilateral Near:     Physical Exam Vitals and nursing note reviewed.  Constitutional:      General: She is not in acute distress.    Appearance: Normal appearance.  HENT:     Head: Normocephalic.  Eyes:     Extraocular Movements: Extraocular movements intact.     Pupils: Pupils are equal, round, and reactive to light.  Abdominal:     General: Bowel sounds are normal.     Palpations: Abdomen is soft.  Musculoskeletal:     Cervical back: Normal range of motion.     Thoracic back: Signs of trauma and tenderness present. No swelling, deformity, spasms or bony tenderness. Decreased range of motion. No scoliosis.     Lumbar back: Signs of trauma and  tenderness present. No swelling or deformity. Decreased range of motion. Negative right straight leg raise test and negative  left straight leg raise test.       Back:     Comments: Bruising noted to the right thoracic spinal region  Skin:    General: Skin is warm and dry.  Neurological:     General: No focal deficit present.     Mental Status: She is alert and oriented to person, place, and time.  Psychiatric:        Mood and Affect: Mood normal.        Behavior: Behavior normal.      UC Treatments / Results  Labs (all labs ordered are listed, but only abnormal results are displayed) Labs Reviewed - No data to display  EKG   Radiology No results found.  Procedures Procedures (including critical care time)  Medications Ordered in UC Medications  ketorolac  (TORADOL ) 30 MG/ML injection 30 mg (has no administration in time range)  dexamethasone  (DECADRON ) injection 10 mg (has no administration in time range)    Initial Impression / Assessment and Plan / UC Course  I have reviewed the triage vital signs and the nursing notes.  Pertinent labs & imaging results that were available during my care of the patient were reviewed by me and considered in my medical decision making (see chart for details).  X-ray of the thoracic and lumbar spine is pending.  Patient was given injection of Toradol  30 mg IM and Decadron  10 mg IM for pain and inflammation.  Cyclobenzaprine  5 mg prescribed for back pain.  Suspect symptoms are most likely a contusion or slight sprain/strain of the thoracic and lumbar spine.  Supportive care recommendations were provided and discussed with the patient to include use of ice or heat, stretching exercises, and staying active.  Discussed ER follow-up precautions with the patient.  Patient was in agreement with this plan of care and verbalizes understanding.  All questions were answered.  Patient stable for discharge.   Final Clinical Impressions(s) / UC Diagnoses    Final diagnoses:  Acute right-sided back pain, unspecified back location   Discharge Instructions   None    ED Prescriptions   None    PDMP not reviewed this encounter.   Gilmer Etta PARAS, NP 09/20/23 1102

## 2023-12-31 ENCOUNTER — Other Ambulatory Visit: Payer: Self-pay | Admitting: Adult Health

## 2024-05-07 ENCOUNTER — Other Ambulatory Visit (HOSPITAL_COMMUNITY): Payer: Self-pay | Admitting: Adult Health

## 2024-05-07 DIAGNOSIS — Z1231 Encounter for screening mammogram for malignant neoplasm of breast: Secondary | ICD-10-CM

## 2024-06-28 ENCOUNTER — Ambulatory Visit: Admitting: Adult Health

## 2024-06-28 ENCOUNTER — Encounter: Payer: Self-pay | Admitting: Adult Health

## 2024-06-28 VITALS — BP 122/81 | HR 64 | Ht 69.0 in | Wt 158.0 lb

## 2024-06-28 DIAGNOSIS — L02224 Furuncle of groin: Secondary | ICD-10-CM | POA: Diagnosis not present

## 2024-06-28 MED ORDER — SILVER SULFADIAZINE 1 % EX CREA: 1.0000 | TOPICAL_CREAM | Freq: Two times a day (BID) | CUTANEOUS | 0 refills | Status: AC

## 2024-06-28 MED ORDER — FLUCONAZOLE 150 MG PO TABS: ORAL_TABLET | ORAL | 1 refills | Status: DC

## 2024-06-28 MED ORDER — SULFAMETHOXAZOLE-TRIMETHOPRIM 800-160 MG PO TABS: 1.0000 | ORAL_TABLET | Freq: Two times a day (BID) | ORAL | 0 refills | Status: DC

## 2024-06-28 NOTE — Progress Notes (Signed)
  Subjective:     Patient ID: Tina Galloway, female   DOB: [birth year], 58 y.o.   MRN: [id number]  HPI Tina Galloway is a 58 year old white female, married, PM in complaining of knot or cyst left groin, it is tender and first noticed about 1-1.5 weeks ago.     Component Value Date/Time   DIAGPAP  02/23/2021 9077    - Negative for intraepithelial lesion or malignancy (NILM)   DIAGPAP  09/03/2018 0000    NEGATIVE FOR INTRAEPITHELIAL LESIONS OR MALIGNANCY.   HPVHIGH Negative 02/23/2021 0922   ADEQPAP  02/23/2021 0922    Satisfactory for evaluation; transformation zone component PRESENT.   ADEQPAP  09/03/2018 0000    Satisfactory for evaluation  endocervical/transformation zone component PRESENT.    PCP is Dr Alphonsa  Review of Systems +knot or cyst left groin, it is tender and first noticed about 1-1.5 weeks ago.   Reviewed past medical,surgical, social and family history. Reviewed medications and allergies.  Objective:   Physical Exam BP 122/81 (BP Location: Left Arm, Patient Position: Sitting, Cuff Size: Normal)   Pulse 64   Ht 5' 9 (1.753 m)   Wt 158 lb (71.7 kg)   LMP  (LMP Unknown)   BMI 23.33 kg/m     Skin is warm and dry, has 1 cm purple firm,tender area, left groin, no lymph nodes felt Fall risk is low  Upstream - 06/28/24 0856       Pregnancy Intention Screening   Does the patient want to become pregnant in the next year? N/A    Does the patient's partner want to become pregnant in the next year? N/A    Would the patient like to discuss contraceptive options today? N/A      Contraception Wrap Up   Current Method Post-Menopause    End Method Post-Menopause    Contraception Counseling Provided No         Examination chaperoned by Clarita Salt LPN  Assessment:     1. Boil of groin (Primary) +1 cm purple firm,tender area, left groin, no lymph nodes felt   Can use warm compress Do not squeeze Will rx septra ds 1 bid x 14 days and silvadene cream bid and diflucan  if gets yeast while on septra ds Meds ordered this encounter  Medications   sulfamethoxazole-trimethoprim (BACTRIM DS) 800-160 MG tablet    Sig: Take 1 tablet by mouth 2 (two) times daily. Take 1 bid    Dispense:  28 tablet    Refill:  0    Supervising Provider:   JAYNE MINDER H [[id number]]   silver sulfADIAZINE (SILVADENE) 1 % cream    Sig: Apply 1 Application topically 2 (two) times daily.    Dispense:  50 g    Refill:  0    Supervising Provider:   JAYNE MINDER H [[id number]]   fluconazole (DIFLUCAN) 150 MG tablet    Sig: Take 1 if needed for yeast, can repeat in 3 days    Dispense:  2 tablet    Refill:  1    Supervising Provider:   JAYNE MINDER DEL [[id number]]    Plan:    Has mammogram scheduled for 08/09/24  Follow up 07/15/24 to recheck boil and then 08/13/24 for pap and physical

## 2024-07-15 ENCOUNTER — Ambulatory Visit: Admitting: Adult Health

## 2024-07-15 ENCOUNTER — Encounter: Payer: Self-pay | Admitting: Adult Health

## 2024-07-15 VITALS — BP 121/73 | HR 66 | Ht 69.0 in | Wt 158.0 lb

## 2024-07-15 DIAGNOSIS — L02224 Furuncle of groin: Secondary | ICD-10-CM

## 2024-07-15 MED ORDER — DOXYCYCLINE HYCLATE 100 MG PO TABS: 100.0000 mg | ORAL_TABLET | Freq: Two times a day (BID) | ORAL | 0 refills | Status: DC

## 2024-07-15 NOTE — Progress Notes (Signed)
  Subjective:     Patient ID: Devere LITTIE Celestia Irish, female   DOB: 01-16-1966, 58 y.o.   MRN: [id number]  HPI Joury is a 58 year old white female, married, PM in for recheck on boil left groin and it is much smaller and not tender.     Component Value Date/Time   DIAGPAP  02/23/2021 9077    - Negative for intraepithelial lesion or malignancy (NILM)   DIAGPAP  09/03/2018 0000    NEGATIVE FOR INTRAEPITHELIAL LESIONS OR MALIGNANCY.   HPVHIGH Negative 02/23/2021 0922   ADEQPAP  02/23/2021 0922    Satisfactory for evaluation; transformation zone component PRESENT.   ADEQPAP  09/03/2018 0000    Satisfactory for evaluation  endocervical/transformation zone component PRESENT.    PCP is Dr Alphonsa Review of Systems Boild is smaller and non tender Reviewed past medical,surgical, social and family history. Reviewed medications and allergies.     Objective:   Physical Exam BP 121/73 (BP Location: Left Arm, Patient Position: Sitting, Cuff Size: Normal)   Pulse 66   Ht 5' 9 (1.753 m)   Wt 158 lb (71.7 kg)   LMP  (LMP Unknown)   BMI 23.33 kg/m     Skin warm and dry.Pelvic: external genitalia is normal in appearance, has purplish area left groin, will grayish fluid when palpated, non tender, < 5mm.  Upstream - 07/15/24 0834       Pregnancy Intention Screening   Does the patient want to become pregnant in the next year? N/A    Does the patient's partner want to become pregnant in the next year? N/A    Would the patient like to discuss contraceptive options today? N/A      Contraception Wrap Up   Current Method Post-Menopause    End Method Post-Menopause    Contraception Counseling Provided No         Examination chaperoned by Clarita Salt LPN  Assessment:     1. Boil of groin (Primary) Boil is smaller and non tender, has small amount grayish fluid when palpated, will rx doxycycline  100 mg 1 bid x 10 days Continue warm compresses, can massage but not squeeze and use silvadene    Meds ordered this encounter  Medications   doxycycline  (VIBRA -TABS) 100 MG tablet    Sig: Take 1 tablet (100 mg total) by mouth 2 (two) times daily.    Dispense:  20 tablet    Refill:  0    Supervising Provider:   JAYNE VONN DEL [[id number]]       Plan:     Follow up prn Has appt 08/13/24 for pap and physical

## 2024-08-09 ENCOUNTER — Ambulatory Visit (HOSPITAL_COMMUNITY)
Admission: RE | Admit: 2024-08-09 | Discharge: 2024-08-09 | Disposition: A | Discharge status: Home / Self Care | Source: Ambulatory Visit | Attending: Adult Health | Admitting: Adult Health

## 2024-08-09 ENCOUNTER — Encounter (HOSPITAL_COMMUNITY): Payer: Self-pay

## 2024-08-09 DIAGNOSIS — Z1231 Encounter for screening mammogram for malignant neoplasm of breast: Secondary | ICD-10-CM | POA: Insufficient documentation

## 2024-08-13 ENCOUNTER — Encounter: Payer: Self-pay | Admitting: Adult Health

## 2024-08-13 ENCOUNTER — Ambulatory Visit: Admitting: Adult Health

## 2024-08-13 ENCOUNTER — Other Ambulatory Visit (HOSPITAL_COMMUNITY)
Admission: RE | Admit: 2024-08-13 | Discharge: 2024-08-13 | Disposition: A | Discharge status: Home / Self Care | Source: Ambulatory Visit | Attending: Adult Health | Admitting: Adult Health

## 2024-08-13 VITALS — BP 133/84 | HR 67 | Ht 69.0 in | Wt 158.0 lb

## 2024-08-13 DIAGNOSIS — R030 Elevated blood-pressure reading, without diagnosis of hypertension: Secondary | ICD-10-CM | POA: Diagnosis not present

## 2024-08-13 DIAGNOSIS — F418 Other specified anxiety disorders: Secondary | ICD-10-CM

## 2024-08-13 DIAGNOSIS — Z01419 Encounter for gynecological examination (general) (routine) without abnormal findings: Secondary | ICD-10-CM

## 2024-08-13 DIAGNOSIS — F419 Anxiety disorder, unspecified: Secondary | ICD-10-CM

## 2024-08-13 DIAGNOSIS — Z780 Asymptomatic menopausal state: Secondary | ICD-10-CM | POA: Diagnosis not present

## 2024-08-13 DIAGNOSIS — Z13220 Encounter for screening for lipoid disorders: Secondary | ICD-10-CM

## 2024-08-13 DIAGNOSIS — R7989 Other specified abnormal findings of blood chemistry: Secondary | ICD-10-CM

## 2024-08-13 MED ORDER — ESCITALOPRAM OXALATE 10 MG PO TABS: 10.0000 mg | ORAL_TABLET | Freq: Every day | ORAL | 3 refills | Status: AC

## 2024-08-13 NOTE — Progress Notes (Signed)
 Patient ID: Tina Galloway, female   DOB: 16-Mar-1966, 58 y.o.   MRN: [id number] History of Present Illness: Tina Galloway is a 58 year old white female, married, PM in for a well woman gyn exam and pap. She is building a new house.   PCP is Dr Alphonsa   Current Medications, Allergies, Past Medical History, Past Surgical History, Family History and Social History were reviewed in [employer] electronic medical record.     Review of Systems: Patient denies any headaches, hearing loss, fatigue, blurred vision, shortness of breath, chest pain, abdominal pain, problems with bowel movements, urination, or intercourse. No joint pain or mood swings.  Denies any vaginal bleeding    Physical Exam:BP 133/84 (BP Location: Left Arm, Patient Position: Sitting, Cuff Size: Normal)   Pulse 67   Ht 5' 9 (1.753 m)   Wt 158 lb (71.7 kg)   LMP  (LMP Unknown)   BMI 23.33 kg/m   General:  Well developed, well nourished, no acute distress Skin:  Warm and dry Neck:  Midline trachea, normal thyroid, good ROM, no lymphadenopathy Lungs; Clear to auscultation bilaterally Breast:  No dominant palpable mass, retraction, or nipple discharge Cardiovascular: Regular rate and rhythm Abdomen:  Soft, non tender, no hepatosplenomegaly Pelvic:  External genitalia is normal in appearance, no lesions.  The vagina is normal in appearance. Urethra has no lesions or masses. The cervix is smooth,pap with HR HPV genotyping performed.   Uterus is felt to be normal size, shape, and contour.  No adnexal masses or tenderness noted.Bladder is non tender, no masses felt. Rectal:Deferred  Extremities/musculoskeletal:  No swelling or varicosities noted, no clubbing or cyanosis Psych:  No mood changes, alert and cooperative,seems happy AA is 5 Fall risk is low    08/13/2024    8:42 AM 08/04/2023    8:30 AM 08/16/2022    1:56 PM  Depression screen PHQ 2/9  Decreased Interest 0 0 0  Down, Depressed, Hopeless 0 0 0  PHQ - 2 Score  0 0 0  Altered sleeping 1 1 1   Tired, decreased energy 0 0 0  Change in appetite 0 0 1  Feeling bad or failure about yourself  0 0 0  Trouble concentrating 1 0 0  Moving slowly or fidgety/restless 0 0 0  Suicidal thoughts 0 0 0  PHQ-9 Score 2 1  2       Data saved with a previous flowsheet row definition       08/13/2024    8:42 AM 08/04/2023    8:30 AM 08/16/2022    1:57 PM 06/03/2022   11:36 AM  GAD 7 : Generalized Anxiety Score  Nervous, Anxious, on Edge 1 1 0 3  Control/stop worrying 0 0 0 2  Worry too much - different things 0 1 1 3   Trouble relaxing 1 1 0 2  Restless 1 1 0 2  Easily annoyed or irritable 0 1 1 3   Afraid - awful might happen 0 0 0 0  Total GAD 7 Score 3 5 2 15     Upstream - 08/13/24 0839       Pregnancy Intention Screening   Does the patient want to become pregnant in the next year? N/A    Does the patient's partner want to become pregnant in the next year? N/A    Would the patient like to discuss contraceptive options today? N/A      Contraception Wrap Up   Current Method Post-Menopause    End  Method Post-Menopause    Contraception Counseling Provided No           Examination chaperoned by Clarita Salt LPN  Impression and plan: 1. Encounter for gynecological examination with Papanicolaou smear of cervix (Primary) Pap sent Pap in 3 years if negative Physical in 1 year Had mammogram 08/09/24, not read yet Colonoscopy per GI  Will check labs - Cytology - PAP( [facility]) - CBC - Comprehensive metabolic panel with GFR - Lipid panel  2. Postmenopause Denies any vaginal bleeding   3. Screening cholesterol level - Lipid panel  4. Low TSH level - TSH + free T4  5. Anxiety and depression Happy with lexapro , will refill Meds ordered this encounter  Medications   escitalopram  (LEXAPRO ) 10 MG tablet    Sig: Take 1 tablet (10 mg total) by mouth daily.    Dispense:  90 tablet    Refill:  3    Supervising Provider:   JAYNE MINDER H  [[id number]]     6. Elevated BP without diagnosis of hypertension BP was normal on recheck

## 2024-08-14 LAB — LIPID PANEL
Chol/HDL Ratio: 1.5 ratio (ref 0.0–4.4)
Cholesterol, Total: 218 mg/dL — ABNORMAL HIGH (ref 100–199)
HDL: 145 mg/dL
LDL Chol Calc (NIH): 61 mg/dL (ref 0–99)
Triglycerides: 67 mg/dL (ref 0–149)
VLDL Cholesterol Cal: 12 mg/dL (ref 5–40)

## 2024-08-14 LAB — CYTOLOGY - PAP
Comment: NEGATIVE
Diagnosis: NEGATIVE
High risk HPV: NEGATIVE

## 2024-08-14 LAB — COMPREHENSIVE METABOLIC PANEL WITH GFR
ALT: 24 IU/L (ref 0–32)
AST: 35 IU/L (ref 0–40)
Albumin: 4.7 g/dL (ref 3.8–4.9)
Alkaline Phosphatase: 81 IU/L (ref 49–135)
BUN/Creatinine Ratio: 15 (ref 9–23)
BUN: 12 mg/dL (ref 6–24)
Bilirubin Total: 0.7 mg/dL (ref 0.0–1.2)
CO2: 22 mmol/L (ref 20–29)
Calcium: 10.2 mg/dL (ref 8.7–10.2)
Chloride: 105 mmol/L (ref 96–106)
Creatinine, Ser: 0.82 mg/dL (ref 0.57–1.00)
Globulin, Total: 2.1 g/dL (ref 1.5–4.5)
Glucose: 92 mg/dL (ref 70–99)
Potassium: 4.7 mmol/L (ref 3.5–5.2)
Sodium: 142 mmol/L (ref 134–144)
Total Protein: 6.8 g/dL (ref 6.0–8.5)
eGFR: 84 mL/min/1.73

## 2024-08-14 LAB — CBC
Hematocrit: 37.8 % (ref 34.0–46.6)
Hemoglobin: 12.9 g/dL (ref 11.1–15.9)
MCH: 33.3 pg — ABNORMAL HIGH (ref 26.6–33.0)
MCHC: 34.1 g/dL (ref 31.5–35.7)
MCV: 98 fL — ABNORMAL HIGH (ref 79–97)
Platelets: 223 x10E3/uL (ref 150–450)
RBC: 3.87 x10E6/uL (ref 3.77–5.28)
RDW: 11.8 % (ref 11.7–15.4)
WBC: 4.8 x10E3/uL (ref 3.4–10.8)

## 2024-08-14 LAB — TSH+FREE T4
Free T4: 1.17 ng/dL (ref 0.82–1.77)
TSH: 1.23 u[IU]/mL (ref 0.450–4.500)

## 2024-08-19 ENCOUNTER — Ambulatory Visit: Payer: Self-pay | Admitting: Adult Health
